# Patient Record
Sex: Female | Born: 1980 | Race: Black or African American | Hispanic: No | Marital: Married | Smoking: Former smoker
Health system: Southern US, Community
[De-identification: ages and names within clinical notes are randomized; demographics above are authoritative.]

## PROBLEM LIST (undated history)

## (undated) HISTORY — PX: LEEP: SHX91

---
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
## 2004-02-13 ENCOUNTER — Emergency Department: Payer: Self-pay | Admitting: Emergency Medicine

## 2004-05-22 ENCOUNTER — Emergency Department: Payer: Self-pay | Admitting: Emergency Medicine

## 2005-06-03 ENCOUNTER — Emergency Department: Payer: Self-pay | Admitting: Emergency Medicine

## 2006-05-21 ENCOUNTER — Emergency Department: Payer: Self-pay | Admitting: Emergency Medicine

## 2007-01-23 ENCOUNTER — Emergency Department: Payer: Self-pay | Admitting: Emergency Medicine

## 2007-05-06 ENCOUNTER — Emergency Department: Payer: Self-pay | Admitting: Emergency Medicine

## 2007-07-06 ENCOUNTER — Emergency Department: Payer: Self-pay | Admitting: Emergency Medicine

## 2007-12-23 ENCOUNTER — Emergency Department: Payer: Self-pay | Admitting: Emergency Medicine

## 2007-12-30 ENCOUNTER — Encounter: Payer: Self-pay | Admitting: Family Medicine

## 2008-02-09 ENCOUNTER — Observation Stay: Payer: Self-pay

## 2008-02-18 ENCOUNTER — Inpatient Hospital Stay: Payer: Self-pay | Admitting: Obstetrics and Gynecology

## 2009-06-16 ENCOUNTER — Emergency Department: Payer: Self-pay | Admitting: Emergency Medicine

## 2010-05-12 ENCOUNTER — Emergency Department: Payer: Self-pay | Admitting: Internal Medicine

## 2011-03-12 ENCOUNTER — Inpatient Hospital Stay: Payer: Self-pay | Admitting: Obstetrics and Gynecology

## 2012-01-30 ENCOUNTER — Emergency Department: Payer: Self-pay | Admitting: Internal Medicine

## 2012-01-30 LAB — COMPREHENSIVE METABOLIC PANEL
Albumin: 4.3 g/dL (ref 3.4–5.0)
Alkaline Phosphatase: 97 U/L (ref 50–136)
BUN: 14 mg/dL (ref 7–18)
Bilirubin,Total: 0.5 mg/dL (ref 0.2–1.0)
Chloride: 104 mmol/L (ref 98–107)
Creatinine: 0.81 mg/dL (ref 0.60–1.30)
EGFR (African American): >60
Glucose: 90 mg/dL (ref 65–99)
SGOT(AST): 27 U/L (ref 15–37)
SGPT (ALT): 42 U/L (ref 12–78)
Sodium: 137 mmol/L (ref 136–145)
Total Protein: 8.9 g/dL — ABNORMAL HIGH (ref 6.4–8.2)

## 2012-01-30 LAB — DRUG SCREEN, URINE
Amphetamines, Ur Screen: NEGATIVE (ref ?–1000)
Benzodiazepine, Ur Scrn: NEGATIVE (ref ?–200)
Cannabinoid 50 Ng, Ur ~~LOC~~: NEGATIVE (ref ?–50)
Cocaine Metabolite,Ur ~~LOC~~: NEGATIVE (ref ?–300)
MDMA (Ecstasy)Ur Screen: NEGATIVE (ref ?–500)
Methadone, Ur Screen: NEGATIVE (ref ?–300)
Opiate, Ur Screen: NEGATIVE (ref ?–300)
Phencyclidine (PCP) Ur S: NEGATIVE (ref ?–25)
Tricyclic, Ur Screen: NEGATIVE (ref ?–1000)

## 2012-01-30 LAB — CBC
MCH: 30 pg (ref 26.0–34.0)
MCHC: 33.5 g/dL (ref 32.0–36.0)
MCV: 90 fL (ref 80–100)
Platelet: 267 10*3/uL (ref 150–440)
RBC: 5.01 10*6/uL (ref 3.80–5.20)
RDW: 13.1 % (ref 11.5–14.5)

## 2012-01-30 LAB — URINALYSIS, COMPLETE
Bilirubin,UR: NEGATIVE
Glucose,UR: NEGATIVE mg/dL (ref 0–75)
Ketone: NEGATIVE
Ph: 5 (ref 4.5–8.0)
Protein: NEGATIVE
Specific Gravity: 1.026 (ref 1.003–1.030)
Squamous Epithelial: 12
WBC UR: 7 /HPF (ref 0–5)

## 2012-01-30 LAB — LIPASE, BLOOD: Lipase: 185 U/L (ref 73–393)

## 2013-04-02 ENCOUNTER — Emergency Department: Payer: Self-pay | Admitting: Emergency Medicine

## 2014-09-15 IMAGING — CT CT CERVICAL SPINE WITHOUT CONTRAST
4 series · 16 of 33 positions shown, 19 images · non-contrast
Comparison: None.

CLINICAL DATA: Slipped on ice, fall

EXAM:
CT HEAD WITHOUT CONTRAST
CT CERVICAL SPINE WITHOUT CONTRAST
TECHNIQUE: Multidetector CT imaging of the head and cervical spine was
performed following the standard protocol without intravenous
contrast. Multiplanar CT image reconstructions of the cervical spine
were also generated.

[Series 3: c spine soft · axial · 0.31mm/px · z∈[+1134,+1186]mm · 3 of 80 slices shown]
[im 14/80  soft-tissue]
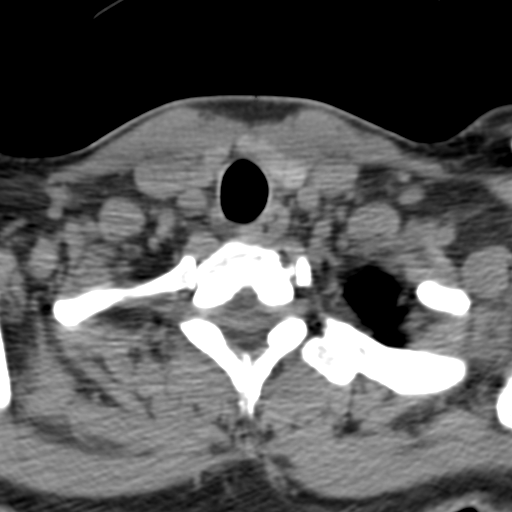
[im 27/80  soft-tissue]
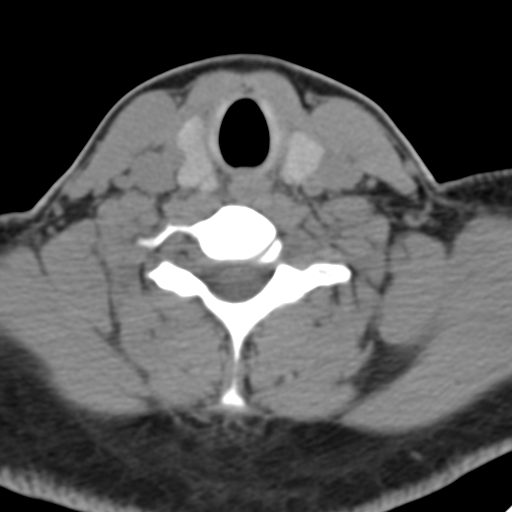
[im 40/80  soft-tissue]
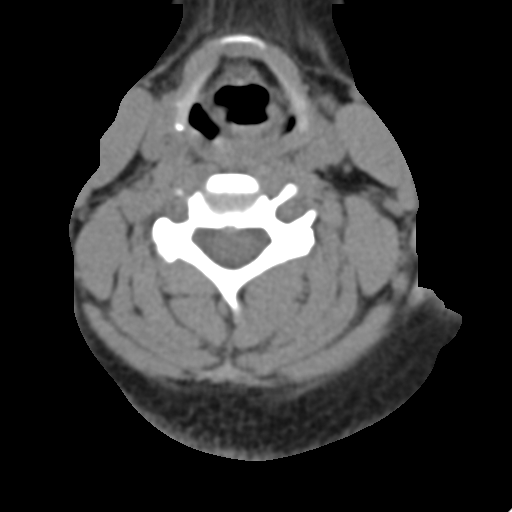

[Series 4: sag bone · sagittal · 0.33mm/px · 5 of 53 slices shown, 6 images]
[im 18/53  bone]
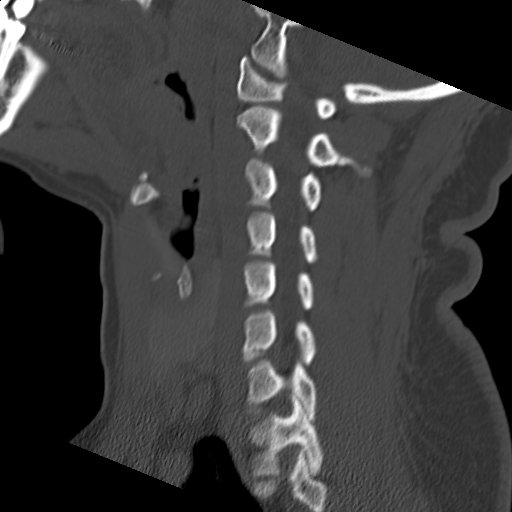
[im 22/53  bone]
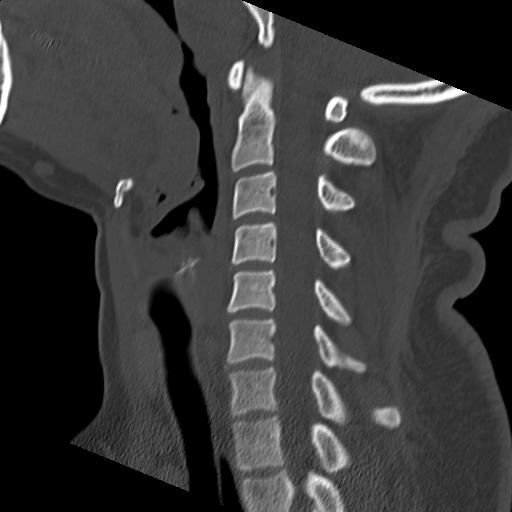
[im 27/53  soft-tissue]
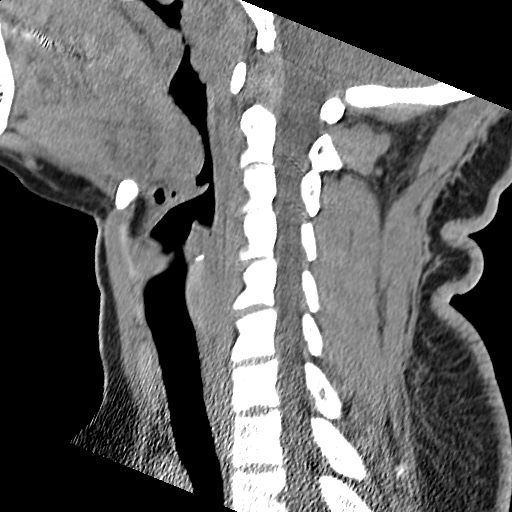
[im 27/53  bone]
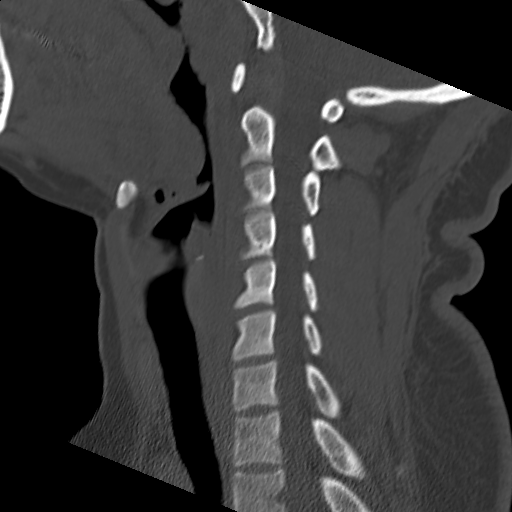
[im 31/53  bone]
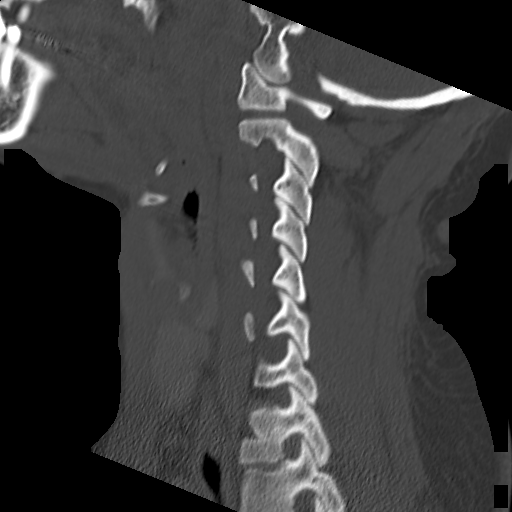
[im 35/53  bone]
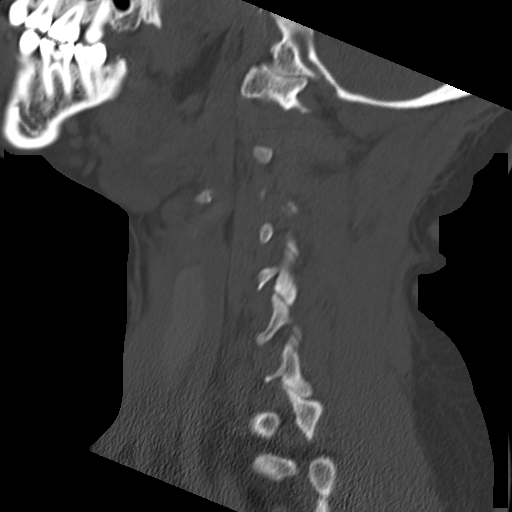

[Series 5: cor bone · coronal · 0.33mm/px · 3 of 36 slices shown]
[im 8/36  bone]
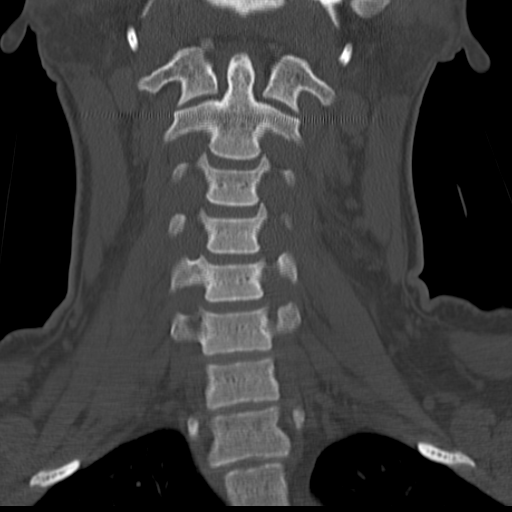
[im 15/36  bone]
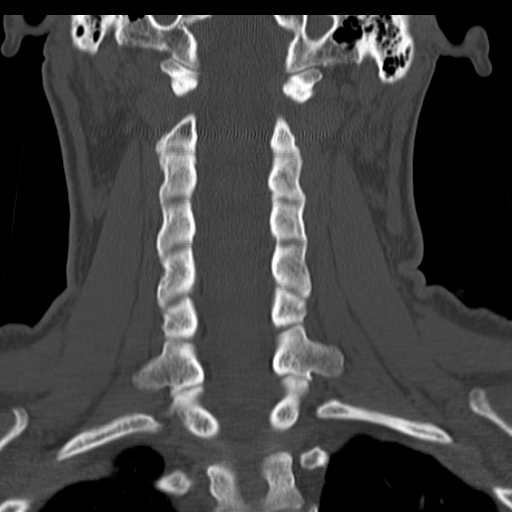
[im 22/36  bone]
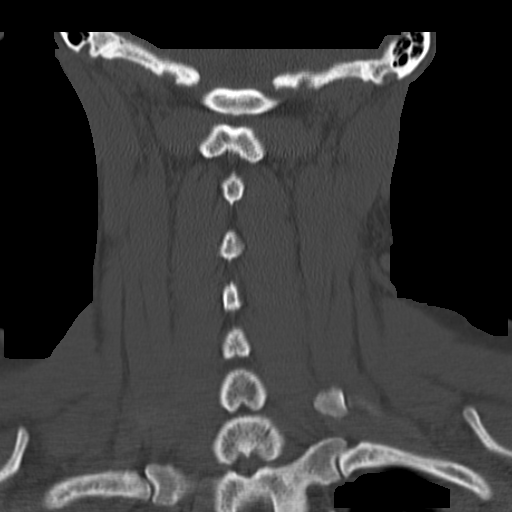

[Series 6: orthogonal axials · axial · 0.29mm/px · z∈[+1109,+1212]mm · 5 of 85 slices shown, 7 images]
[im 15/85  soft-tissue]
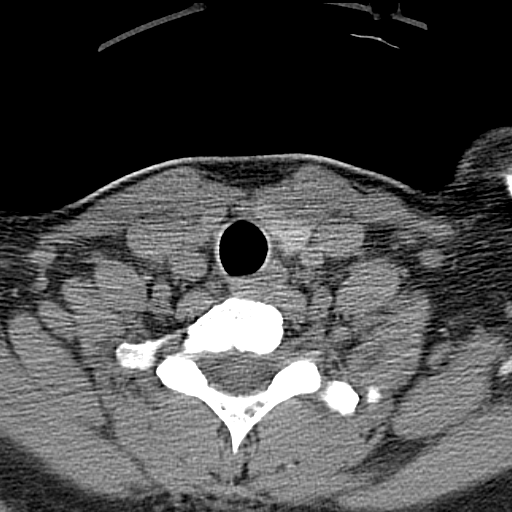
[im 15/85  bone]
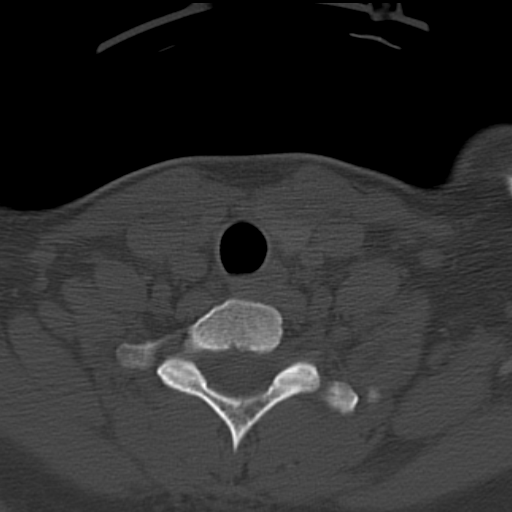
[im 29/85  bone]
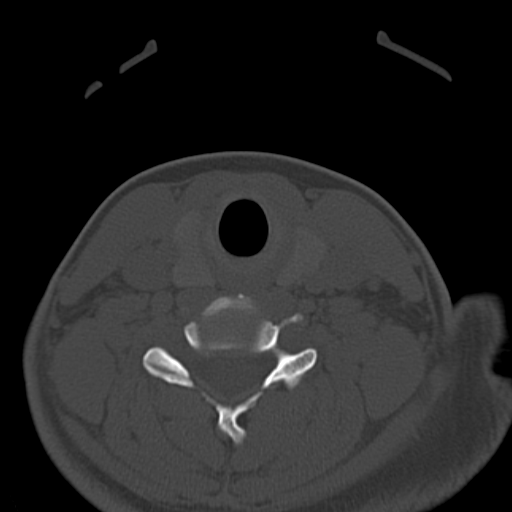
[im 43/85  bone]
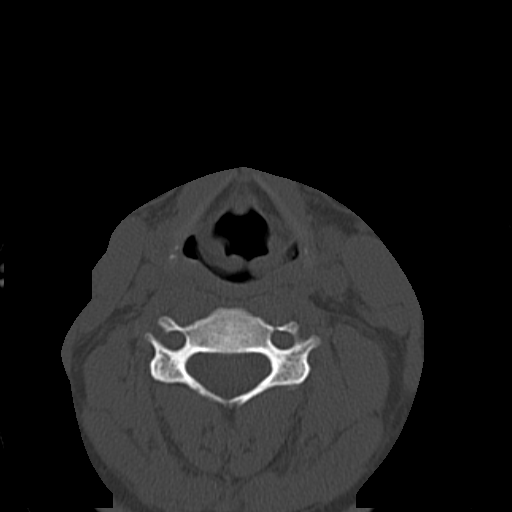
[im 57/85  bone]
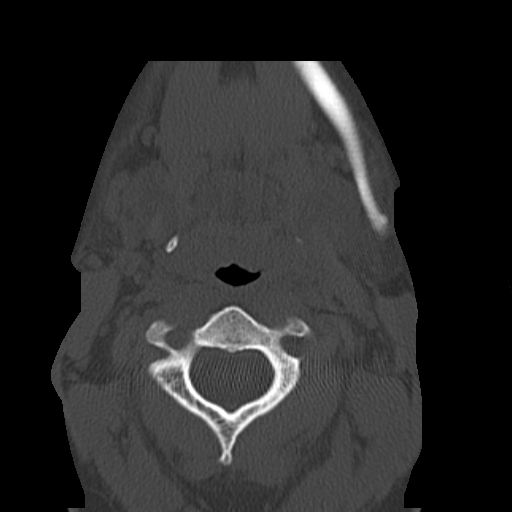
[im 71/85  soft-tissue]
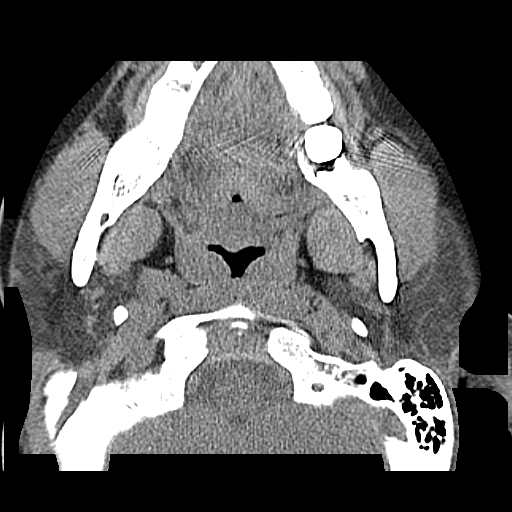
[im 71/85  bone]
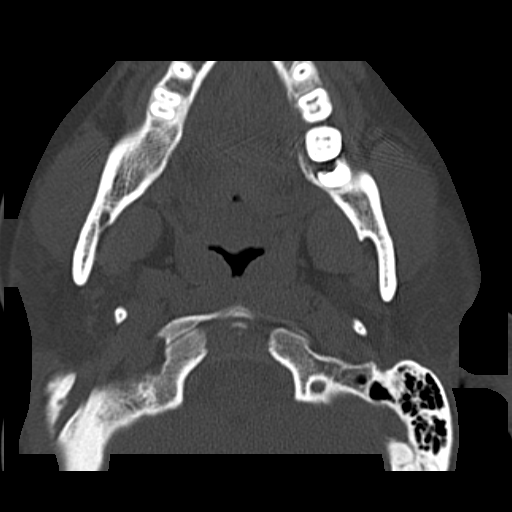

[16 of 33 positions shown; findings below may reference images not displayed]

FINDINGS: CT HEAD FINDINGS

Negative for acute intracranial hemorrhage, acute infarction, mass,
mass effect, hydrocephalus or midline shift. Gray-white
differentiation is preserved throughout. No focal scalp hematoma or
calvarial fracture. Globes and orbits are symmetric and intact
bilaterally.

CT CERVICAL SPINE FINDINGS

No acute fracture, malalignment or prevertebral soft tissue
swelling. Very mild degenerative change with early ossification of
the anterior anulus fibrosis at C5-C6 and C6-C7. Unremarkable CT
appearance of the thyroid gland. No acute soft tissue abnormality.
The lung apices are unremarkable.
IMPRESSION: CT HEAD

Negative head CT.

CT C SPINE

No acute fracture or malalignment.

## 2016-07-11 ENCOUNTER — Emergency Department
Admission: EM | Admit: 2016-07-11 | Discharge: 2016-07-11 | Disposition: A | Discharge status: Home / Self Care | Payer: BLUE CROSS/BLUE SHIELD | Attending: Emergency Medicine | Admitting: Emergency Medicine

## 2016-07-11 ENCOUNTER — Encounter: Payer: Self-pay | Admitting: *Deleted

## 2016-07-11 ENCOUNTER — Emergency Department: Payer: BLUE CROSS/BLUE SHIELD

## 2016-07-11 DIAGNOSIS — R1012 Left upper quadrant pain: Secondary | ICD-10-CM

## 2016-07-11 DIAGNOSIS — K219 Gastro-esophageal reflux disease without esophagitis: Secondary | ICD-10-CM | POA: Insufficient documentation

## 2016-07-11 LAB — BASIC METABOLIC PANEL
Anion gap: 7 (ref 5–15)
BUN: 12 mg/dL (ref 6–20)
CHLORIDE: 105 mmol/L (ref 101–111)
CO2: 25 mmol/L (ref 22–32)
CREATININE: 0.75 mg/dL (ref 0.44–1.00)
Calcium: 9.2 mg/dL (ref 8.9–10.3)
GFR calc Af Amer: >60 mL/min (ref >60)
GFR calc non Af Amer: >60 mL/min (ref >60)
Glucose, Bld: 94 mg/dL (ref 65–99)
Potassium: 3.6 mmol/L (ref 3.5–5.1)
Sodium: 137 mmol/L (ref 135–145)

## 2016-07-11 LAB — CBC
HCT: 41.2 % (ref 35.0–47.0)
Hemoglobin: 13.9 g/dL (ref 12.0–16.0)
MCH: 30 pg (ref 26.0–34.0)
MCHC: 33.7 g/dL (ref 32.0–36.0)
MCV: 88.8 fL (ref 80.0–100.0)
PLATELETS: 279 10*3/uL (ref 150–440)
RBC: 4.64 MIL/uL (ref 3.80–5.20)
RDW: 13.6 % (ref 11.5–14.5)
WBC: 5.7 10*3/uL (ref 3.6–11.0)

## 2016-07-11 LAB — HEPATIC FUNCTION PANEL
ALT: 16 U/L (ref 14–54)
AST: 21 U/L (ref 15–41)
Albumin: 4 g/dL (ref 3.5–5.0)
Alkaline Phosphatase: 61 U/L (ref 38–126)
BILIRUBIN TOTAL: 0.6 mg/dL (ref 0.3–1.2)
Total Protein: 7.5 g/dL (ref 6.5–8.1)

## 2016-07-11 LAB — LIPASE, BLOOD: LIPASE: 34 U/L (ref 11–51)

## 2016-07-11 LAB — TROPONIN I: Troponin I: <0.03 ng/mL (ref <0.03)

## 2016-07-11 MED ORDER — METOCLOPRAMIDE HCL 10 MG PO TABS
10.0000 mg | ORAL_TABLET | Freq: Once | ORAL | Status: AC
  Administered 2016-07-11: 10 mg via ORAL
  Filled 2016-07-11: qty 1

## 2016-07-11 MED ORDER — METOCLOPRAMIDE HCL 10 MG PO TABS: 10.0000 mg | ORAL_TABLET | Freq: Four times a day (QID) | ORAL | 0 refills | Status: DC | PRN

## 2016-07-11 MED ORDER — FAMOTIDINE 20 MG PO TABS
40.0000 mg | ORAL_TABLET | Freq: Once | ORAL | Status: AC
  Administered 2016-07-11: 40 mg via ORAL
  Filled 2016-07-11: qty 2

## 2016-07-11 MED ORDER — FAMOTIDINE 20 MG PO TABS: 20.0000 mg | ORAL_TABLET | Freq: Two times a day (BID) | ORAL | 0 refills | Status: DC

## 2016-07-11 MED ORDER — GI COCKTAIL ~~LOC~~
30.0000 mL | ORAL | Status: AC
  Administered 2016-07-11: 30 mL via ORAL
  Filled 2016-07-11: qty 30

## 2016-07-11 NOTE — ED Notes (Signed)
Pt verbalized understanding of discharge instructions. NAD at this time. 

## 2016-07-11 NOTE — ED Triage Notes (Signed)
States mid chest burning for several weeks, states [facility] was seen [facility] and after an EKG they gave her carafate, states no relief after taking the carafate for 1 day, awake and alert in no acute distress

## 2016-07-11 NOTE — ED Provider Notes (Signed)
[facility] Emergency Department Provider Note  ____________________________________________  Time seen: Approximately 1:04 PM  I have reviewed the triage vital signs and the nursing notes.   HISTORY  Chief Complaint Chest Pain    HPI Nicole Mcmillan is a 36 y.o. female who complains of left upper quadrant abdominal pain radiating up into the chest. This is been on for several weeks, intermittent. Not exertional, not pleuritic. No fever or chills. No hematemesis. No vomiting or diarrhea or constipation. No prior abdominal surgeries. Not affected by eating. Nonradiating. Mild to moderate intensity. Aching.  She saw urgent care yesterday and they gave her Carafate but this has not resolved her symptoms in the last 24 hours.     History reviewed. No pertinent past medical history.   There are no active problems to display for this patient.    No past surgical history on file. Negative  Prior to Admission medications   Medication Sig Start Date End Date Taking? Authorizing Provider  famotidine (PEPCID) 20 MG tablet Take 1 tablet (20 mg total) by mouth 2 (two) times daily. 07/11/16   Sharman Cheek, MD  metoCLOPramide (REGLAN) 10 MG tablet Take 1 tablet (10 mg total) by mouth every 6 (six) hours as needed. 07/11/16   Sharman Cheek, MD     Allergies Flagyl [metronidazole]   History reviewed. No pertinent family history.  Social History Social History  Substance Use Topics  . Smoking status: Not on file  . Smokeless tobacco: Not on file  . Alcohol use Not on file  No tobacco or alcohol use   Review of Systems  Constitutional:   No fever or chills.  ENT:   No sore throat. No rhinorrhea. Lymphatic: No swollen glands, No extremity swelling Endocrine: No hot/cold flashes. No significant weight change. No neck swelling. Cardiovascular:   No chest pain or syncope. Respiratory:   No dyspnea or cough. Gastrointestinal:   Positive for upper  abdominal pain without, vomiting and diarrhea.  Genitourinary:   Negative for dysuria or difficulty urinating. Musculoskeletal:   Negative for focal pain or swelling Neurological:   Negative for headaches or weakness. All other systems reviewed and are negative except as documented above in ROS and HPI.  ____________________________________________   PHYSICAL EXAM:  VITAL SIGNS: ED Triage Vitals [07/11/16 0957]  Enc Vitals Group     BP 138/73     Pulse Rate 72     Resp 18     Temp 99 F (37.2 C)     Temp Source Oral     SpO2 100 %     Weight 180 lb (81.6 kg)     Height  (1.702 m)     Head Circumference      Peak Flow      Pain Score 7     Pain Loc      Pain Edu?      Excl. in GC?     Vital signs reviewed, nursing assessments reviewed.   Constitutional:   Alert and oriented. Well appearing and in no distress. Eyes:   No scleral icterus. No conjunctival pallor. PERRL. EOMI.  No nystagmus. ENT   Head:   Normocephalic and atraumatic.   Nose:   No congestion/rhinnorhea. No septal hematoma   Mouth/Throat:   MMM, no pharyngeal erythema. No peritonsillar mass.    Neck:   No stridor. No SubQ emphysema. No meningismus. Hematological/Lymphatic/Immunilogical:   No cervical lymphadenopathy. Cardiovascular:   RRR. Symmetric bilateral radial and DP pulses.  No murmurs.  Respiratory:   Normal respiratory effort without tachypnea nor retractions. Breath sounds are clear and equal bilaterally. No wheezes/rales/rhonchi.Chest wall nontender Gastrointestinal:   Soft With mild left upper quadrant tenderness. Non distended. There is no CVA tenderness.  No rebound, rigidity, or guarding. Genitourinary:   deferred Musculoskeletal:   Normal range of motion in all extremities. No joint effusions.  No lower extremity tenderness.  No edema. Neurologic:   Normal speech and language.  CN 2-10 normal. Motor grossly intact. No gross focal neurologic deficits are appreciated.   Skin:    Skin is warm, dry and intact. No rash noted.  No petechiae, purpura, or bullae.  ____________________________________________    LABS (pertinent positives/negatives) (all labs ordered are listed, but only abnormal results are displayed) Labs Reviewed  HEPATIC FUNCTION PANEL - Abnormal; Notable for the following:       Result Value   Bilirubin, Direct <0.1 (*)    All other components within normal limits  BASIC METABOLIC PANEL  CBC  TROPONIN I  LIPASE, BLOOD   ____________________________________________   EKG  Interpreted by me  Date: 07/11/2016  Rate: 71  Rhythm: normal sinus rhythm  QRS Axis: normal  Intervals: normal  ST/T Wave abnormalities: normal  Conduction Disutrbances: none  Narrative Interpretation: unremarkable      ____________________________________________    RADIOLOGY  Dg Chest 2 View  Result Date: 07/11/2016 CLINICAL DATA:  Chest pain for several weeks. New onset shortness of breath today. EXAM: CHEST  2 VIEW COMPARISON:  None. FINDINGS: The heart size and mediastinal contours are within normal limits. Tiny right lung calcified granulomas noted. No evidence of pulmonary infiltrate or edema. No evidence of pneumothorax or pleural effusion. The visualized skeletal structures are unremarkable. IMPRESSION: No active cardiopulmonary disease. Electronically Signed   By: Myles Rosenthal M.D.   On: 07/11/2016 10:18    ____________________________________________   PROCEDURES Procedures  ____________________________________________   INITIAL IMPRESSION / ASSESSMENT AND PLAN / ED COURSE  Pertinent labs & imaging results that were available during my care of the patient were reviewed by me and considered in my medical decision making (see chart for details).  Patient presents with left upper quadrant abdominal pain, history highly consistent with GERD and gastritis.Considering the patient's symptoms, medical history, and physical examination  today, I have low suspicion for cholecystitis or biliary pathology, pancreatitis, perforation or bowel obstruction, hernia, intra-abdominal abscess, AAA or dissection, volvulus or intussusception, mesenteric ischemia, or appendicitis.  Patient given antacids in the ED for symptomatically relief. Labs unremarkable, EKG negative and normal. On discharge her on Pepcid and Reglan, continue the Carafate at home, follow-up with primary care.         ____________________________________________   FINAL CLINICAL IMPRESSION(S) / ED DIAGNOSES  Final diagnoses:  Left upper quadrant pain  Gastroesophageal reflux disease, esophagitis presence not specified      New Prescriptions   FAMOTIDINE (PEPCID) 20 MG TABLET    Take 1 tablet (20 mg total) by mouth 2 (two) times daily.   METOCLOPRAMIDE (REGLAN) 10 MG TABLET    Take 1 tablet (10 mg total) by mouth every 6 (six) hours as needed.     Portions of this note were generated with dragon dictation software. Dictation errors may occur despite best attempts at proofreading.    Sharman Cheek, MD 07/11/16 [phone]

## 2016-07-11 NOTE — Discharge Instructions (Signed)
Your lab tests today are unremarkable.  Take Pepcid and Reglan to decrease your stomach acid and control these symptoms.

## 2017-01-15 ENCOUNTER — Encounter: Payer: Self-pay | Admitting: Emergency Medicine

## 2017-01-15 DIAGNOSIS — R102 Pelvic and perineal pain: Secondary | ICD-10-CM | POA: Diagnosis present

## 2017-01-15 DIAGNOSIS — Z79899 Other long term (current) drug therapy: Secondary | ICD-10-CM | POA: Diagnosis not present

## 2017-01-15 DIAGNOSIS — N760 Acute vaginitis: Secondary | ICD-10-CM | POA: Insufficient documentation

## 2017-01-15 DIAGNOSIS — B9689 Other specified bacterial agents as the cause of diseases classified elsewhere: Secondary | ICD-10-CM | POA: Diagnosis not present

## 2017-01-15 DIAGNOSIS — F17200 Nicotine dependence, unspecified, uncomplicated: Secondary | ICD-10-CM | POA: Diagnosis not present

## 2017-01-15 DIAGNOSIS — A5901 Trichomonal vulvovaginitis: Secondary | ICD-10-CM | POA: Diagnosis not present

## 2017-01-15 LAB — URINALYSIS, COMPLETE (UACMP) WITH MICROSCOPIC
BACTERIA UA: NONE SEEN
BILIRUBIN URINE: NEGATIVE
GLUCOSE, UA: NEGATIVE mg/dL
HGB URINE DIPSTICK: NEGATIVE
Ketones, ur: NEGATIVE mg/dL
LEUKOCYTES UA: NEGATIVE
NITRITE: NEGATIVE
Protein, ur: NEGATIVE mg/dL
SPECIFIC GRAVITY, URINE: 1.027 (ref 1.005–1.030)
pH: 5 (ref 5.0–8.0)

## 2017-01-15 LAB — POCT PREGNANCY, URINE: Preg Test, Ur: NEGATIVE

## 2017-01-15 NOTE — ED Triage Notes (Addendum)
Patient to ER for c/o vaginal discharge. States at times it smells like blood, other times has foul odor. States LMP was 12/23/16 and lasted one week, but was lighter than normal. Patient states approx one week later, she developed current discharge and lower pelvic pain. States pain is worst at what feels like her cervix. Also reports taking Macca root with MVI.

## 2017-01-16 ENCOUNTER — Emergency Department
Admission: EM | Admit: 2017-01-16 | Discharge: 2017-01-16 | Disposition: A | Discharge status: Home / Self Care | Payer: BLUE CROSS/BLUE SHIELD | Attending: Emergency Medicine | Admitting: Emergency Medicine

## 2017-01-16 DIAGNOSIS — B9689 Other specified bacterial agents as the cause of diseases classified elsewhere: Secondary | ICD-10-CM

## 2017-01-16 DIAGNOSIS — A5901 Trichomonal vulvovaginitis: Secondary | ICD-10-CM

## 2017-01-16 DIAGNOSIS — N760 Acute vaginitis: Secondary | ICD-10-CM

## 2017-01-16 LAB — WET PREP, GENITAL
SPERM: NONE SEEN
YEAST WET PREP: NONE SEEN

## 2017-01-16 LAB — CHLAMYDIA/NGC RT PCR (ARMC ONLY)
CHLAMYDIA TR: NOT DETECTED
N gonorrhoeae: NOT DETECTED

## 2017-01-16 MED ORDER — METRONIDAZOLE 500 MG PO TABS
500.0000 mg | ORAL_TABLET | Freq: Once | ORAL | Status: AC
  Administered 2017-01-16: 500 mg via ORAL
  Filled 2017-01-16: qty 1

## 2017-01-16 MED ORDER — METRONIDAZOLE 500 MG PO TABS: 500.0000 mg | ORAL_TABLET | Freq: Two times a day (BID) | ORAL | 0 refills | Status: AC

## 2017-01-16 NOTE — ED Notes (Signed)
Report off to kasey rn  

## 2017-01-16 NOTE — ED Notes (Addendum)
Pt denies rxn to the flagyl given earlier. Pt in NAD at this time, denies rash. EDP notified.

## 2017-01-16 NOTE — ED Notes (Signed)

## 2017-01-16 NOTE — ED Notes (Signed)
ED Provider at bedside. 

## 2017-01-16 NOTE — ED Provider Notes (Signed)
[facility] Emergency Department Provider Note   First MD Initiated Contact with Patient 01/16/17 0130     (approximate)  I have reviewed the triage vital signs and the nursing notes.   HISTORY  Chief Complaint Vaginal Discharge and Pelvic Pain   HPI Nicole Mcmillan is a 36 y.o. female presents to the emergency department with complaint of malodorous vaginal discharge times approximately one week patient admits to lower pelvic discomfort none at present. Patient denies any fever no nausea or vomiting. Patient admits to previous history of Chlamydia when she was a teenager. Patient admits to unprotected sex one partner   Past medical history Chlamydia There are no active problems to display for this patient.   Past surgical history None  Prior to Admission medications   Medication Sig Start Date End Date Taking? Authorizing Provider  famotidine (PEPCID) 20 MG tablet Take 1 tablet (20 mg total) by mouth 2 (two) times daily. 07/11/16   Sharman Cheek, MD  metoCLOPramide (REGLAN) 10 MG tablet Take 1 tablet (10 mg total) by mouth every 6 (six) hours as needed. 07/11/16   Sharman Cheek, MD    Allergies Flagyl [metronidazole]  No family history on file.  Social History Social History  Substance Use Topics  . Smoking status: Current Every Day Smoker  . Smokeless tobacco: Never Used  . Alcohol use No    Review of Systems Constitutional: No fever/chills Eyes: No visual changes. ENT: No sore throat. Cardiovascular: Denies chest pain. Respiratory: Denies shortness of breath. Gastrointestinal: No abdominal pain.  No nausea, no vomiting.  No diarrhea.  No constipation. Genitourinary: Negative for dysuria. Positive for vaginal discharge Musculoskeletal: Negative for neck pain.  Negative for back pain. Integumentary: Negative for rash. Neurological: Negative for headaches, focal weakness or  numbness.   ____________________________________________   PHYSICAL EXAM:  VITAL SIGNS: ED Triage Vitals  Enc Vitals Group     BP 01/15/17 2130 125/84     Pulse Rate 01/15/17 2130 98     Resp 01/15/17 2130 18     Temp 01/15/17 2130 98.7 F (37.1 C)     Temp Source 01/15/17 2130 Oral     SpO2 01/15/17 2130 99 %     Weight --      Height 01/15/17 2126 1.651 m (5\' 5" )     Head Circumference --      Peak Flow --      Pain Score 01/15/17 2125 6     Pain Loc --      Pain Edu? --      Excl. in GC? --     Constitutional: Alert and oriented. Well appearing and in no acute distress. Eyes: Conjunctivae are normal.  Head: Atraumatic. Mouth/Throat: Mucous membranes are moist.  Oropharynx non-erythematous. Neck: No stridor.   Cardiovascular: Normal rate, regular rhythm. Good peripheral circulation. Grossly normal heart sounds. Respiratory: Normal respiratory effort.  No retractions. Lungs CTAB. Gastrointestinal: Soft and nontender. No distention.  Genitourinary: Edmar Blankenburg frothy malodorous discharge Musculoskeletal: No lower extremity tenderness nor edema. No gross deformities of extremities. Neurologic:  Normal speech and language. No gross focal neurologic deficits are appreciated.  Skin:  Skin is warm, dry and intact. No rash noted. Psychiatric: Mood and affect are normal. Speech and behavior are normal.  ____________________________________________   LABS (all labs ordered are listed, but only abnormal results are displayed)  Labs Reviewed  WET PREP, GENITAL - Abnormal; Notable for the following:       Result Value  Trich, Wet Prep PRESENT (*)    Clue Cells Wet Prep HPF POC PRESENT (*)    WBC, Wet Prep HPF POC MODERATE (*)    All other components within normal limits  URINALYSIS, COMPLETE (UACMP) WITH MICROSCOPIC - Abnormal; Notable for the following:    Color, Urine YELLOW (*)    APPearance CLEAR (*)    Squamous Epithelial / LPF 0-5 (*)    All other components within  normal limits  CHLAMYDIA/NGC RT PCR ([facility] ONLY)  POC URINE PREG, ED  POCT PREGNANCY, URINE    Procedures   ____________________________________________   INITIAL IMPRESSION / ASSESSMENT AND PLAN / ED COURSE  As part of my medical decision making, I reviewed the following data within the electronic MEDICAL RECORD [id number] year-old female presenting with above stated history physical exam of malodorous vaginal discharge. Cervical swab revealed evidence of bacterial vaginosis and Trichomonas as such patient will be prescribed Flagyl for home. Patient unclear of any reaction to Flagyl in the past and a such patient was given a dose of Flagyl emergency department and observed without any symptoms.     ____________________________________________  FINAL CLINICAL IMPRESSION(S) / ED DIAGNOSES  Final diagnoses:  BV (bacterial vaginosis)  Trichomoniasis of vagina     MEDICATIONS GIVEN DURING THIS VISIT:  Medications - No data to display   NEW OUTPATIENT MEDICATIONS STARTED DURING THIS VISIT:  New Prescriptions   No medications on file    Modified Medications   No medications on file    Discontinued Medications   No medications on file     Note:  This document was prepared using Dragon voice recognition software and may include unintentional dictation errors.    Darci CurrentBrown, [place] N, MD 01/16/17 [phone]

## 2017-04-22 DIAGNOSIS — E669 Obesity, unspecified: Secondary | ICD-10-CM | POA: Insufficient documentation

## 2017-04-22 LAB — HM PAP SMEAR: HM Pap smear: NEGATIVE

## 2017-12-24 IMAGING — CR DG CHEST 2V
1 series · 2 of 2 positions shown · non-contrast
Comparison: None.

CLINICAL DATA: Chest pain for several weeks. New onset shortness of
breath today.

EXAM:
CHEST  2 VIEW

[Series 1: dg chest 2 view · 0.14mm/px · 2 of 2 slices shown]
[im 1/2]
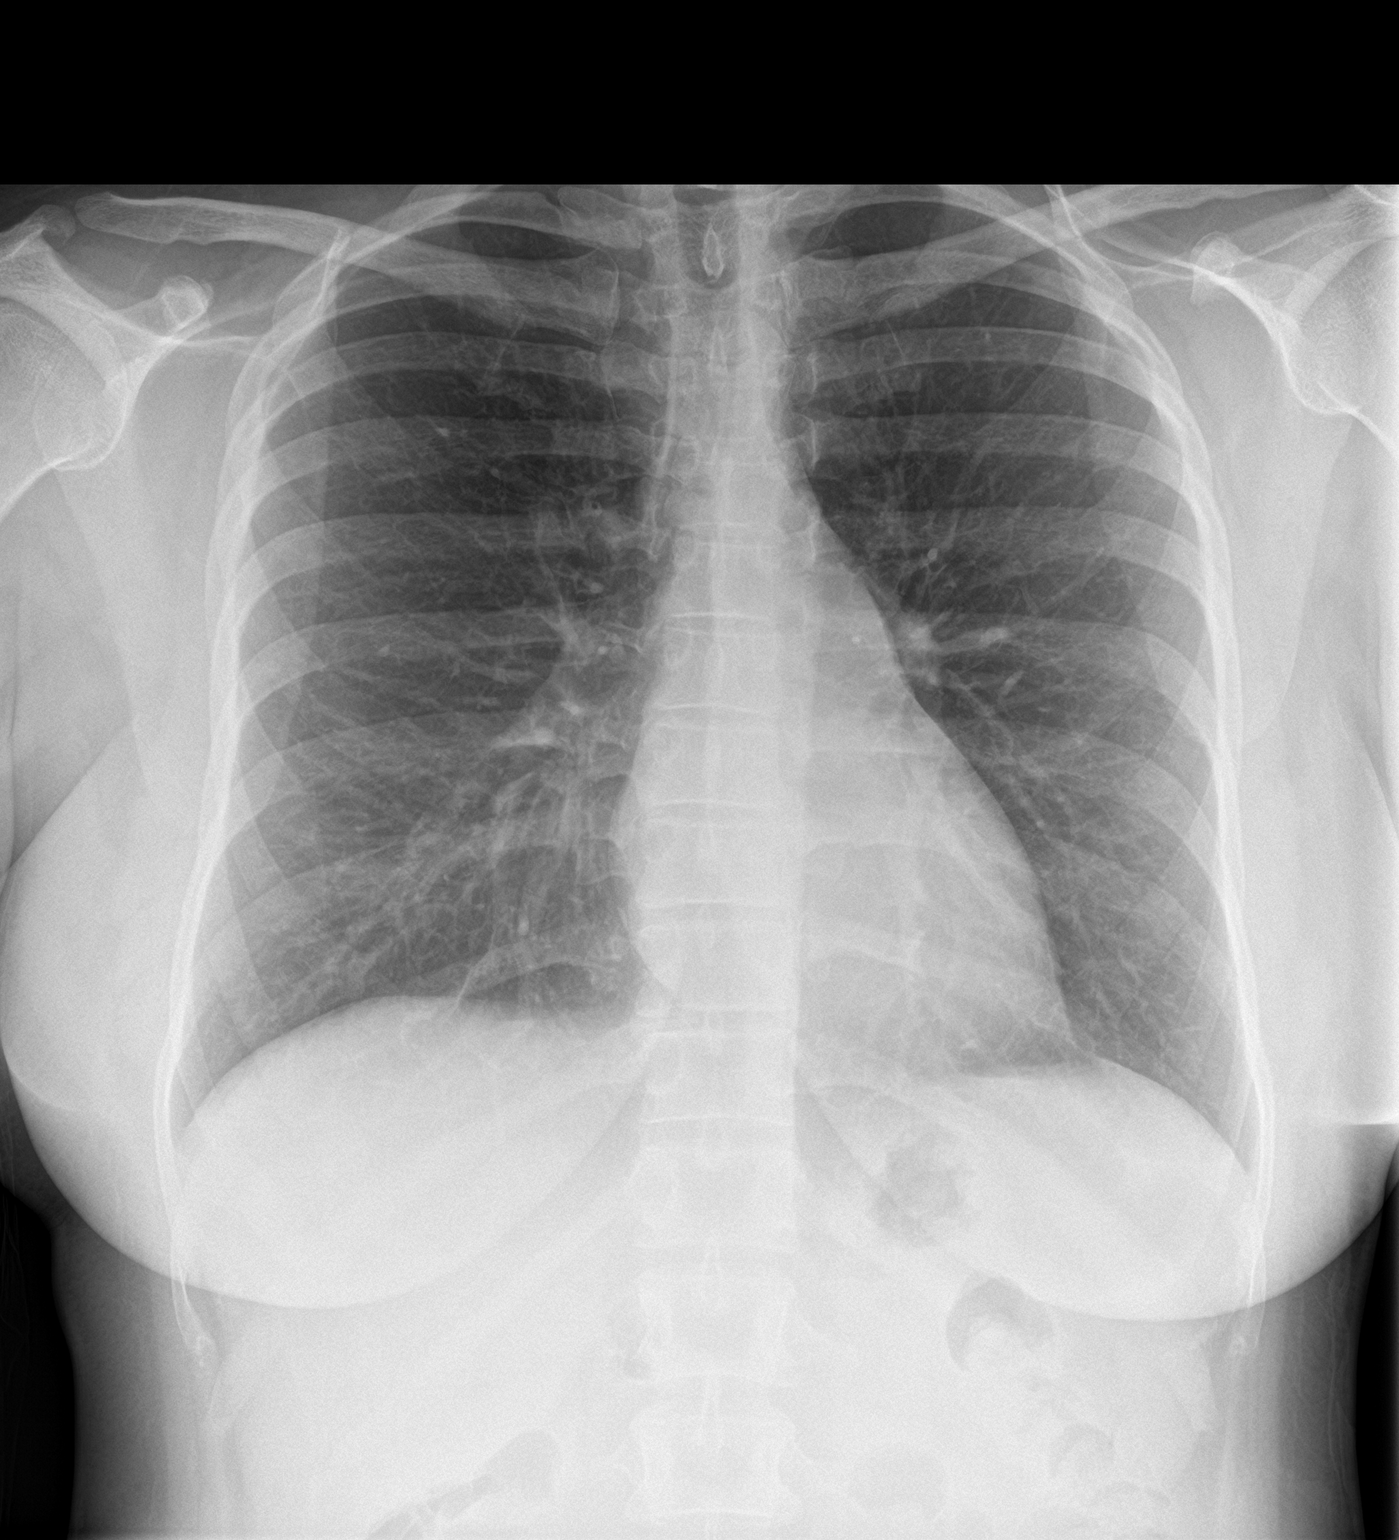
[im 2/2]
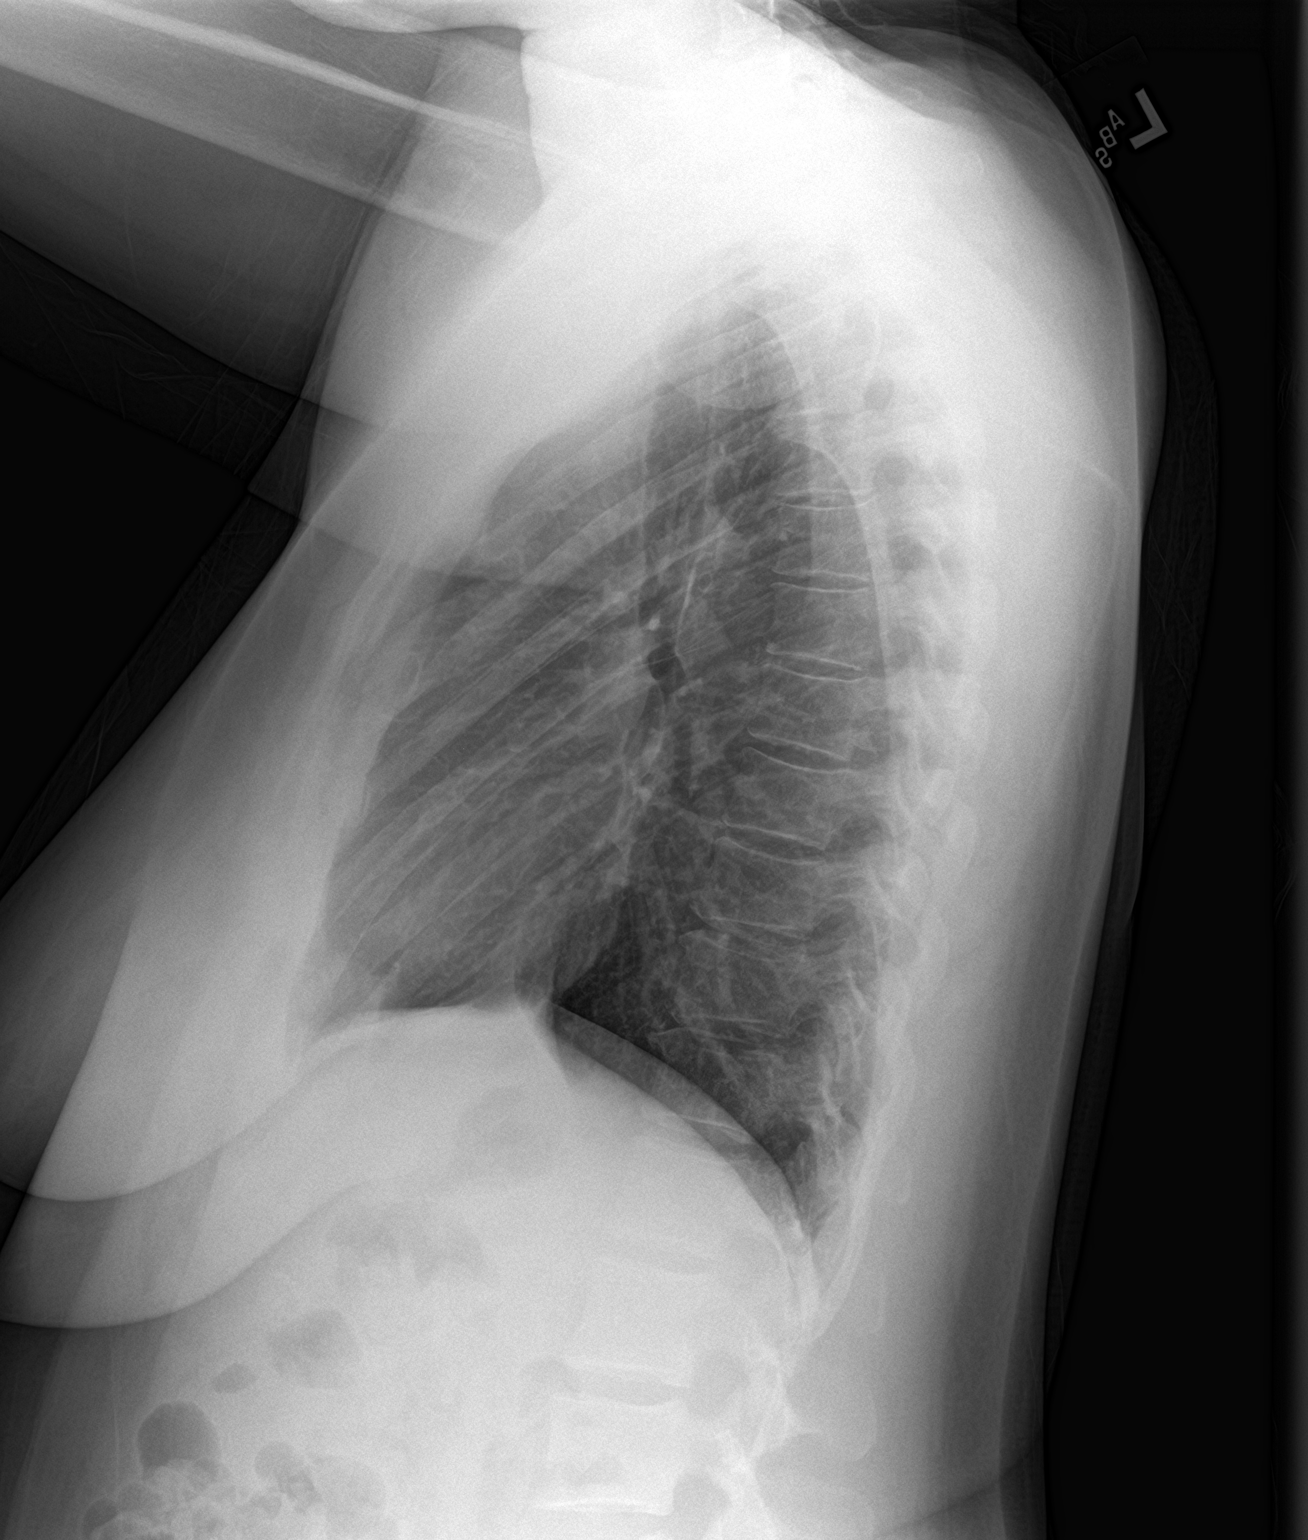

[2 of 2 positions shown; findings below may reference images not displayed]

FINDINGS: The heart size and mediastinal contours are within normal limits.
Tiny right lung calcified granulomas noted. No evidence of pulmonary
infiltrate or edema. No evidence of pneumothorax or pleural
effusion. The visualized skeletal structures are unremarkable.
IMPRESSION: No active cardiopulmonary disease.

## 2018-06-02 DIAGNOSIS — Z1331 Encounter for screening for depression: Secondary | ICD-10-CM | POA: Insufficient documentation

## 2018-11-03 DIAGNOSIS — E669 Obesity, unspecified: Secondary | ICD-10-CM

## 2018-11-03 DIAGNOSIS — Z1331 Encounter for screening for depression: Secondary | ICD-10-CM

## 2018-11-04 ENCOUNTER — Other Ambulatory Visit: Payer: Self-pay

## 2018-11-04 ENCOUNTER — Ambulatory Visit (LOCAL_COMMUNITY_HEALTH_CENTER): Payer: Self-pay

## 2018-11-04 VITALS — BP 125/88 | Ht 63.0 in | Wt 208.0 lb

## 2018-11-04 DIAGNOSIS — Z30013 Encounter for initial prescription of injectable contraceptive: Secondary | ICD-10-CM

## 2018-11-04 DIAGNOSIS — Z3009 Encounter for other general counseling and advice on contraception: Secondary | ICD-10-CM

## 2018-11-04 MED ORDER — MULTI-VITAMIN/MINERALS PO TABS: 1.0000 | ORAL_TABLET | Freq: Every day | ORAL | 0 refills | Status: DC

## 2018-11-04 MED ORDER — MEDROXYPROGESTERONE ACETATE 150 MG/ML IM SUSP
150.0000 mg | Freq: Once | INTRAMUSCULAR | Status: AC
  Administered 2018-11-04: 150 mg via INTRAMUSCULAR

## 2018-11-04 NOTE — Progress Notes (Signed)
Depo administered without difficulty per 06/02/2018 written order of Jerline Pain FNP-BC and client tolerated without complaint. Shona Needles, RN

## 2019-01-21 ENCOUNTER — Other Ambulatory Visit: Payer: Self-pay

## 2019-01-21 ENCOUNTER — Ambulatory Visit (LOCAL_COMMUNITY_HEALTH_CENTER): Payer: Self-pay

## 2019-01-21 VITALS — BP 137/80 | Ht 63.0 in | Wt 206.0 lb

## 2019-01-21 DIAGNOSIS — Z3009 Encounter for other general counseling and advice on contraception: Secondary | ICD-10-CM

## 2019-01-21 DIAGNOSIS — Z30013 Encounter for initial prescription of injectable contraceptive: Secondary | ICD-10-CM

## 2019-01-21 MED ORDER — MEDROXYPROGESTERONE ACETATE 150 MG/ML IM SUSP
150.0000 mg | Freq: Once | INTRAMUSCULAR | Status: AC
  Administered 2019-01-21: 150 mg via INTRAMUSCULAR

## 2019-01-21 NOTE — Progress Notes (Signed)
Depo given per Wilburn Mylar FNP 05/2018 order. Tolerated well Aileen Fass, RN

## 2019-04-21 ENCOUNTER — Ambulatory Visit (LOCAL_COMMUNITY_HEALTH_CENTER): Payer: 59

## 2019-04-21 ENCOUNTER — Other Ambulatory Visit: Payer: Self-pay

## 2019-04-21 VITALS — BP 147/101 | Ht 63.0 in | Wt 200.5 lb

## 2019-04-21 DIAGNOSIS — Z3009 Encounter for other general counseling and advice on contraception: Secondary | ICD-10-CM

## 2019-04-21 DIAGNOSIS — Z30013 Encounter for initial prescription of injectable contraceptive: Secondary | ICD-10-CM | POA: Diagnosis not present

## 2019-04-21 MED ORDER — MEDROXYPROGESTERONE ACETATE 150 MG/ML IM SUSP
150.0000 mg | Freq: Once | INTRAMUSCULAR | Status: AC
  Administered 2019-04-21: 17:00:00 150 mg via INTRAMUSCULAR

## 2019-04-21 NOTE — Progress Notes (Signed)
Client with current Depo order (06/02/2018 per Jerilee Hoh FNP-BC), but BP today is 147/101 with 10 minute repeat essentially unchanged. Following consutl with Sadie Haber PA, the following verbal orders given: 1) may administer Depo 150 mg IM today and 2) recommend BP evaluation with PCP. Client tolerated Depo without complaint. Client provided [employer] info sheet and counseled on income based fees at community health centers. Jossie Ng, RN

## 2019-04-23 NOTE — Progress Notes (Signed)
Consulted by RN re: elevated BP.  Ok to give Depo today.  Rec that patient follow up with PCP for further evaluation for BP.

## 2019-07-21 ENCOUNTER — Ambulatory Visit: Payer: 59

## 2019-07-22 ENCOUNTER — Other Ambulatory Visit: Payer: Self-pay | Admitting: Physician Assistant

## 2019-07-22 DIAGNOSIS — Z3042 Encounter for surveillance of injectable contraceptive: Secondary | ICD-10-CM

## 2019-07-22 MED ORDER — MEDROXYPROGESTERONE ACETATE 150 MG/ML IM SUSP: 150.0000 mg | Freq: Once | INTRAMUSCULAR | Status: DC

## 2019-07-22 NOTE — Progress Notes (Signed)
Per chart review, last RP 05/2018, CBE due 2022 and pap due 2024.  OK for Depo x 1, but will need to schedule for RP.

## 2019-07-30 ENCOUNTER — Encounter: Payer: Self-pay | Admitting: Physician Assistant

## 2019-07-30 ENCOUNTER — Ambulatory Visit: Payer: 59

## 2019-07-30 ENCOUNTER — Other Ambulatory Visit: Payer: Self-pay

## 2019-07-30 ENCOUNTER — Ambulatory Visit (LOCAL_COMMUNITY_HEALTH_CENTER): Payer: 59 | Admitting: Physician Assistant

## 2019-07-30 VITALS — BP 124/86 | Ht 63.0 in | Wt 196.8 lb

## 2019-07-30 DIAGNOSIS — Z3009 Encounter for other general counseling and advice on contraception: Secondary | ICD-10-CM | POA: Diagnosis not present

## 2019-07-30 DIAGNOSIS — Z30013 Encounter for initial prescription of injectable contraceptive: Secondary | ICD-10-CM

## 2019-07-30 DIAGNOSIS — E669 Obesity, unspecified: Secondary | ICD-10-CM | POA: Diagnosis not present

## 2019-07-30 DIAGNOSIS — Z720 Tobacco use: Secondary | ICD-10-CM | POA: Diagnosis not present

## 2019-07-30 DIAGNOSIS — Z6835 Body mass index (BMI) 35.0-35.9, adult: Secondary | ICD-10-CM

## 2019-07-30 DIAGNOSIS — Z1331 Encounter for screening for depression: Secondary | ICD-10-CM

## 2019-07-30 MED ORDER — MULTI-VITAMIN/MINERALS PO TABS: 1.0000 | ORAL_TABLET | Freq: Every day | ORAL | 0 refills | Status: DC

## 2019-07-30 MED ORDER — MEDROXYPROGESTERONE ACETATE 150 MG/ML IM SUSP
150.0000 mg | INTRAMUSCULAR | Status: AC
  Administered 2019-07-30 – 2020-03-24 (×4): 150 mg via INTRAMUSCULAR

## 2019-07-30 NOTE — Progress Notes (Signed)
Depo given and tolerated well. Accepts MVI's. Tawny Hopping, RN

## 2019-07-30 NOTE — Progress Notes (Signed)
Family Planning Visit- Repeat Yearly Visit  Subjective:  Nicole Mcmillan is a 39 y.o. [phone]  being seen today for an well woman visit and to discuss family planning options.    She is currently using Depo-Provera injections for pregnancy prevention. Patient reports she does not want a pregnancy in the next year. Patient  has Positive depression screening; Obesity, unspecified; and Tobacco abuse on their problem list.  Chief Complaint  Patient presents with  . Gynecologic Exam  . Contraception    Patient reports she does not have menses on DMPA, and likes that. 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  Patient denies concerns today. Denies vag discharge/odor/itch.    See flowsheet for other program required questions.   Body mass index is 34.86 kg/m. - Patient is eligible for diabetes screening based on BMI and age >20?  no HA1C ordered? not applicable  Patient reports 1 of partners in last year. Desires STI screening?  No - states no exposure or symptoms   Has patient been screened once for HCV in the past?  No; pt declines testing.  No results found for: HCVAB  Does the patient have current of drug use, have a partner with drug use, and/or has been incarcerated since last result? No  If yes-- Screen for HCV through [facility]   Does the patient meet criteria for HBV testing? No  Criteria:  -Household, sexual or needle sharing contact with HBV -History of drug use -HIV positive -Those with known Hep C   Health Maintenance Due  Topic Date Due  . HIV Screening  Never done  . COVID-19 Vaccine (1) Never done  . TETANUS/TDAP  Never done    ROS  The following portions of the patient's history were reviewed and updated as appropriate: allergies, current medications, past family history, past medical history, past social history, past surgical history and problem list. Problem  list updated.  Objective:   Vitals:   07/30/19 1542  BP: 124/86  Weight: 196 lb 12.8 oz (89.3 kg)  Height: 5\' 3"  (1.6 m)    Physical Exam    Assessment and Plan:  ROLLA Mcmillan is a 39 y.o. female [phone] presenting to the [facility] Department for an yearly well woman exam/family planning visit  Contraception counseling: Reviewed all forms of birth control options in the tiered based approach. available including abstinence; over the counter/barrier methods; hormonal contraceptive medication including pill, patch, ring, injection,contraceptive implant, ECP; hormonal and nonhormonal IUDs; permanent sterilization options including vasectomy and the various tubal sterilization modalities. Risks, benefits, and typical effectiveness rates were reviewed.  Questions were answered.  Written information was also given to the patient to review.  Patient desires DMPA, this was presribed for patient. She will follow up in  3 mo  for surveillance.  She was told to call with any further questions, or with any concerns about this method of contraception.  Emphasized use of condoms 100% of the time for STI prevention.  1. Family planning Continue DMPA 150mg  IM today and repeat every 3 mo for 1 year. Next cervical cancer screening due 2024. - Multiple Vitamins-Minerals (MULTIVITAMIN WITH MINERALS) tablet; Take 1 tablet by mouth daily.  Dispense: 100 tablet; Refill: 0 - medroxyPROGESTERone (DEPO-PROVERA) injection 150 mg  2. Positive depression screening PHQ-9 score has decreased from 19 (05/2018) to 13 today, but is still abnormal. Pt  states she saw LCSW for a while last year, but declines referral back to her now because she "wants to deal with it" on her own. Enc walking.   3. Class 2 obesity with body mass index (BMI) of 35.0 to 35.9 in adult, unspecified obesity type, unspecified whether serious comorbidity present Enc diet/exercise; declined nutrition referral.  4. Tobacco  abuse Enc cessation - counseled via 5 As, pt not interested in quitting at present.   Return in about 3 months (around 10/30/2019) for Routine DMPA injection.  No future appointments.  Lora Havens, PA-C

## 2019-07-30 NOTE — Progress Notes (Signed)
Here today for PE and Depo. Last PE here was 06/02/2018, last Pap Smear was 04/22/2017 and last Depo was 04/21/2019 (14.2 weeks.) Declines STD screening today. Tawny Hopping, RN

## 2019-10-19 ENCOUNTER — Ambulatory Visit (LOCAL_COMMUNITY_HEALTH_CENTER): Payer: 59

## 2019-10-19 ENCOUNTER — Other Ambulatory Visit: Payer: Self-pay

## 2019-10-19 VITALS — BP 126/87 | Ht 63.0 in | Wt 200.5 lb

## 2019-10-19 DIAGNOSIS — Z30013 Encounter for initial prescription of injectable contraceptive: Secondary | ICD-10-CM

## 2019-10-19 DIAGNOSIS — Z3009 Encounter for other general counseling and advice on contraception: Secondary | ICD-10-CM

## 2019-10-19 NOTE — Progress Notes (Signed)
Depo administered without difficulty per 07/30/2019 written order of A. Streilein PA-C and client tolerated without complaint. Client states has plenty of MVI at home because she buys some in addition to the bottle we give her at the [facility]. Jossie Ng, RN

## 2020-01-07 ENCOUNTER — Ambulatory Visit (LOCAL_COMMUNITY_HEALTH_CENTER): Payer: 59

## 2020-01-07 ENCOUNTER — Other Ambulatory Visit: Payer: Self-pay

## 2020-01-07 VITALS — BP 134/89 | Ht 63.0 in | Wt 201.5 lb

## 2020-01-07 DIAGNOSIS — Z30013 Encounter for initial prescription of injectable contraceptive: Secondary | ICD-10-CM | POA: Diagnosis not present

## 2020-01-07 DIAGNOSIS — Z3009 Encounter for other general counseling and advice on contraception: Secondary | ICD-10-CM | POA: Diagnosis not present

## 2020-01-07 NOTE — Progress Notes (Signed)
11 weeks 3 days post depo. Depo administered per order A. Streilein, PA-C dated 07/30/19. Tolerated well. Depo consent signed today. Next depo due 03/24/2020. Reminder given. Jerel Shepherd, RN

## 2020-03-24 ENCOUNTER — Other Ambulatory Visit: Payer: Self-pay

## 2020-03-24 ENCOUNTER — Ambulatory Visit (LOCAL_COMMUNITY_HEALTH_CENTER): Payer: 59

## 2020-03-24 VITALS — BP 139/93 | Ht 63.0 in | Wt 201.5 lb

## 2020-03-24 DIAGNOSIS — Z3009 Encounter for other general counseling and advice on contraception: Secondary | ICD-10-CM | POA: Diagnosis not present

## 2020-03-24 DIAGNOSIS — Z30013 Encounter for initial prescription of injectable contraceptive: Secondary | ICD-10-CM | POA: Diagnosis not present

## 2020-03-24 NOTE — Progress Notes (Signed)
Consulted by RN re: patient with elevated BP and due to get Depo.  Reviewed RN note and agree that it reflects our discussion and my recommendations for patient.

## 2020-03-24 NOTE — Progress Notes (Signed)
11 weeks 0 days post depo. BP elevated today at 139/93. Reports vaping and drinking tea before appt. Explains increased stress today d/t recent death of aunt. Reports no PCP. Local provider resource list given. RN Counseled pt to establish PCP and to take BP regularly and record and have PCP evaluate. Pt in agreement.  Consult C. Rolley Sims, [place] who gives ok for Depo today based on order by A. Streilein, PA-C dated 07/30/2019. Depo given Left Deltoid without problem. Next depo due 06/09/20, pt aware. Jerel Shepherd, RN

## 2020-06-21 ENCOUNTER — Other Ambulatory Visit: Payer: Self-pay

## 2020-06-21 ENCOUNTER — Ambulatory Visit (LOCAL_COMMUNITY_HEALTH_CENTER): Payer: 59

## 2020-06-21 VITALS — BP 133/88 | Ht 63.0 in | Wt 202.0 lb

## 2020-06-21 DIAGNOSIS — Z3009 Encounter for other general counseling and advice on contraception: Secondary | ICD-10-CM | POA: Diagnosis not present

## 2020-06-21 DIAGNOSIS — Z30013 Encounter for initial prescription of injectable contraceptive: Secondary | ICD-10-CM | POA: Diagnosis not present

## 2020-06-21 MED ORDER — MEDROXYPROGESTERONE ACETATE 150 MG/ML IM SUSP
150.0000 mg | Freq: Once | INTRAMUSCULAR | Status: AC
  Administered 2020-06-21: 150 mg via INTRAMUSCULAR

## 2020-06-21 NOTE — Progress Notes (Signed)
12 weeks 5 days post depo. Voices no concerns. Depo given today per order by A. Streilein, PA-C dated 07/30/2019. Tolerated depo well in R delt. Physical due at next depo, approx 09/06/2020, pt has reminder. Jerel Shepherd, RN

## 2020-08-12 ENCOUNTER — Encounter: Payer: Self-pay | Admitting: Physician Assistant

## 2020-08-12 ENCOUNTER — Ambulatory Visit: Payer: 59

## 2020-08-12 ENCOUNTER — Other Ambulatory Visit: Payer: Self-pay

## 2020-08-12 ENCOUNTER — Ambulatory Visit: Payer: Self-pay | Admitting: Physician Assistant

## 2020-08-12 DIAGNOSIS — Z113 Encounter for screening for infections with a predominantly sexual mode of transmission: Secondary | ICD-10-CM

## 2020-08-12 LAB — WET PREP FOR TRICH, YEAST, CLUE
Trichomonas Exam: NEGATIVE
Yeast Exam: NEGATIVE

## 2020-08-12 NOTE — Progress Notes (Signed)
Wet mount reviewed with provider. All orders were completed.

## 2020-08-12 NOTE — Progress Notes (Signed)
  [facility] Department STI clinic/screening visit  Subjective:  Nicole Mcmillan is a 40 y.o. female being seen today for an STI screening visit. The patient reports they do not have symptoms.  Patient reports that they do not desire a pregnancy in the next year.   They reported they are not interested in discussing contraception today.  No LMP recorded. Patient has had an injection.   Patient has the following medical conditions:   Patient Active Problem List   Diagnosis Date Noted  . Tobacco abuse 07/30/2019  . Positive depression screening 06/02/2018  . Obesity, unspecified 04/22/2017    Chief Complaint  Patient presents with  . SEXUALLY TRANSMITTED DISEASE    STI screen    HPI  Patient reports that she is not having any symptoms but would like a screening.  Denies chronic conditions and regular medicines.  States last HIV test was years ago and last pap was in 2019.  States that she does not have a period since she uses Depo as her BCM.   See flowsheet for further details and programmatic requirements.    The following portions of the patient's history were reviewed and updated as appropriate: allergies, current medications, past medical history, past social history, past surgical history and problem list.  Objective:  There were no vitals filed for this visit.  Physical Exam Constitutional:      General: She is not in acute distress.    Appearance: Normal appearance.  HENT:     Head: Normocephalic and atraumatic.  Eyes:     Conjunctiva/sclera: Conjunctivae normal.  Pulmonary:     Effort: Pulmonary effort is normal.  Skin:    General: Skin is warm and dry.  Neurological:     Mental Status: She is alert and oriented to person, place, and time.  Psychiatric:        Mood and Affect: Mood normal.        Behavior: Behavior normal.        Thought Content: Thought content normal.        Judgment: Judgment normal.      Assessment and Plan:  Nicole Mcmillan is a 40 y.o. female presenting to the [facility] Department for STI screening  1. Screening for STD (sexually transmitted disease) Patient into clinic without symptoms. Patient declines pelvic exam and opts to self-collect vaginal samples.  Counseled how to collect for accurate results. Reviewed with patient that wet mount is normal and no treatment is indicated today. Rec condoms with all sex. Await test results.  Counseled that RN will call if needs to RTC for treatment once results are back. - WET PREP FOR TRICH, YEAST, CLUE - Chlamydia/Gonorrhea [facility] - [facility] - Syphilis Serology, [facility]     No follow-ups on file.  No future appointments.  Matt Holmes, PA

## 2020-08-19 LAB — HM HIV SCREENING LAB: HM HIV Screening: NEGATIVE

## 2020-09-23 ENCOUNTER — Ambulatory Visit (LOCAL_COMMUNITY_HEALTH_CENTER): Payer: 59 | Admitting: Family Medicine

## 2020-09-23 ENCOUNTER — Other Ambulatory Visit: Payer: Self-pay

## 2020-09-23 VITALS — BP 113/73 | HR 92 | Temp 98.8°F | Resp 18 | Ht 64.0 in | Wt 205.0 lb

## 2020-09-23 DIAGNOSIS — Z0000 Encounter for general adult medical examination without abnormal findings: Secondary | ICD-10-CM | POA: Diagnosis not present

## 2020-09-23 DIAGNOSIS — Z3009 Encounter for other general counseling and advice on contraception: Secondary | ICD-10-CM | POA: Diagnosis not present

## 2020-09-23 DIAGNOSIS — Z30013 Encounter for initial prescription of injectable contraceptive: Secondary | ICD-10-CM | POA: Diagnosis not present

## 2020-09-23 MED ORDER — MEDROXYPROGESTERONE ACETATE 150 MG/ML IM SUSP
150.0000 mg | INTRAMUSCULAR | Status: AC
  Administered 2020-09-23 – 2021-07-06 (×4): 150 mg via INTRAMUSCULAR

## 2020-09-23 NOTE — Progress Notes (Signed)
Family Planning Visit- Repeat Yearly Visit  Subjective:  Nicole Mcmillan is a 40 y.o. [phone]  being seen today for an annual wellness visit and to discuss contraception options.   The patient is currently using Depo Provera for pregnancy prevention. Patient does not want a pregnancy in the next year. Patient has the following medical problems: has Positive depression screening; Obesity, unspecified; and Tobacco abuse on their problem list.  Chief Complaint  Patient presents with   Annual Exam   Contraception    Patient reports here for physical and depo.    Patient denies any problems, or s/sx of infection  See flowsheet for other program required questions.   Body mass index is 35.19 kg/m. - Patient is eligible for diabetes screening based on BMI and age >34?  not applicable HA1C ordered? not applicable  Patient reports 2 of partners in last year. Desires STI screening?  No - patient declined    Has patient been screened once for HCV in the past?  No  No results found for: HCVAB  Does the patient have current of drug use, have a partner with drug use, and/or has been incarcerated since last result? No  If yes-- Screen for HCV through [facility]   Does the patient meet criteria for HBV testing? No  Criteria:  -Household, sexual or needle sharing contact with HBV -History of drug use -HIV positive -Those with known Hep C   Health Maintenance Due  Topic Date Due   COVID-19 Vaccine (1) Never done   Pneumococcal Vaccine 73-28 Years old (1 - PCV) Never done   Hepatitis C Screening  Never done   TETANUS/TDAP  Never done    Review of Systems  Constitutional:  Negative for chills, fever, malaise/fatigue and weight loss.  HENT:  Negative for congestion, hearing loss and sore throat.   Eyes:  Negative for blurred vision, double vision and photophobia.  Respiratory:  Negative for shortness of breath.   Cardiovascular:  Negative for chest pain.  Gastrointestinal:   Negative for abdominal pain, blood in stool, constipation, diarrhea, heartburn, nausea and vomiting.  Genitourinary:  Negative for dysuria and frequency.  Musculoskeletal:  Negative for back pain, joint pain and neck pain.  Skin:  Negative for itching and rash.  Neurological:  Negative for dizziness, weakness and headaches.  Endo/Heme/Allergies:  Does not bruise/bleed easily.  Psychiatric/Behavioral:  Negative for depression, substance abuse and suicidal ideas.    The following portions of the patient's history were reviewed and updated as appropriate: allergies, current medications, past family history, past medical history, past social history, past surgical history and problem list. Problem list updated.  Objective:   Vitals:   09/23/20 1447  BP: 113/73  Pulse: 92  Resp: 18  Temp: 98.8 F (37.1 C)  TempSrc: Oral  Weight: 205 lb (93 kg)  Height: 5\' 4"  (1.626 m)    Physical Exam Vitals and nursing note reviewed.  Constitutional:      Appearance: Normal appearance.  HENT:     Head: Normocephalic and atraumatic.     Mouth/Throat:     Mouth: Mucous membranes are moist.     Dentition: Abnormal dentition. No dental caries.     Pharynx: No oropharyngeal exudate or posterior oropharyngeal erythema.     Comments: Poor dental care, no visible dental abnormalities  Eyes:     General: No scleral icterus. Neck:     Thyroid: No thyroid mass or thyromegaly.  Cardiovascular:     Rate and  Rhythm: Normal rate and regular rhythm.     Pulses: Normal pulses.     Heart sounds: Normal heart sounds.  Pulmonary:     Effort: Pulmonary effort is normal.     Breath sounds: Normal breath sounds.  Chest:     Comments: Breasts:        Right: Normal. No swelling, mass, nipple discharge, skin change or tenderness.        Left: Normal. No swelling, mass, nipple discharge, skin change or tenderness.   Abdominal:     General: Abdomen is flat. Bowel sounds are normal.     Palpations: Abdomen is  soft.  Genitourinary:    Comments: Deferred  Musculoskeletal:        General: Normal range of motion.     Cervical back: Normal range of motion and neck supple.  Lymphadenopathy:     Cervical: No cervical adenopathy.  Skin:    General: Skin is warm and dry.  Neurological:     General: No focal deficit present.     Mental Status: She is alert and oriented to person, place, and time.  Psychiatric:        Behavior: Behavior normal.      Assessment and Plan:  Nicole Mcmillan is a 40 y.o. female [phone] presenting to the [facility] Department for an yearly wellness and contraception visit   1. Routine general medical examination at a health care facility Well woman exam CBE today- discussed self breast exam, teach back completed.   PAP due 07/2022  2. Encounter for initial prescription of injectable contraceptive RX : Depo 150 mg IM every 11-13 weeks x 1 year.   - medroxyPROGESTERone (DEPO-PROVERA) injection 150 mg  3. Family planning counseling Contraception counseling: Reviewed all forms of birth control options in the tiered based approach. available including abstinence; over the counter/barrier methods; hormonal contraceptive medication including pill, patch, ring, injection,contraceptive implant, ECP; hormonal and nonhormonal IUDs; permanent sterilization options including vasectomy and the various tubal sterilization modalities. Risks, benefits, and typical effectiveness rates were reviewed.  Questions were answered.  Written information was also given to the patient to review.  Patient desires depo provera, this was prescribed for patient. She will follow up in  11-13 weeks  for surveillance.  She was told to call with any further questions, or with any concerns about this method of contraception.  Emphasized use of condoms 100% of the time for STI prevention.  Patient was not offered ECP based on current BCM.     Return in about 3 months (around 12/24/2020) for  depo.  No future appointments.  Wendi Snipes, FNP

## 2020-09-23 NOTE — Progress Notes (Signed)
13 weeks 2 days post depo. Voices no concerns. Depo given today per order by M.Garner, FNP dated 09/23/20. Tolerated depo well in R delt. Physical done today, up to date. Depo consent signed today.   Pt has reminder card for next depo in 12 weeks.   Floy Sabina, RN

## 2021-01-09 ENCOUNTER — Ambulatory Visit (LOCAL_COMMUNITY_HEALTH_CENTER): Payer: 59 | Admitting: Physician Assistant

## 2021-01-09 ENCOUNTER — Other Ambulatory Visit: Payer: Self-pay

## 2021-01-09 VITALS — BP 137/98 | Ht 64.0 in | Wt 209.0 lb

## 2021-01-09 DIAGNOSIS — Z3042 Encounter for surveillance of injectable contraceptive: Secondary | ICD-10-CM | POA: Diagnosis not present

## 2021-01-09 DIAGNOSIS — Z3009 Encounter for other general counseling and advice on contraception: Secondary | ICD-10-CM

## 2021-01-09 NOTE — Progress Notes (Signed)
Although documented at same time, there was at least a 10 minute time difference between initial and repeat BP. Per verbal order of Sadie Haber PA, may administer Depo as written 09/23/2020 per Elveria Rising FNP-BC (elevated BP at appt today - [facility] info sheet given to client and encouraged medical evaluaton of BP). Client counseled recommended to receive Depo every 11 - 13 weeks for most effective pregnancy prevention and ot minimize breakthough vaginal bleeding. Client tolerated Depo injection without complaint. Jossie Ng, RN

## 2021-01-10 NOTE — Progress Notes (Signed)
Patient into clinic for Depo and is 15 wk 3d since last shot.  Patient with elevated BP today, but OK to give Depo.  Reviewed RN note and agree with advice to follow up with PCP regarding BP.

## 2021-04-06 ENCOUNTER — Ambulatory Visit (LOCAL_COMMUNITY_HEALTH_CENTER): Payer: 59

## 2021-04-06 ENCOUNTER — Other Ambulatory Visit: Payer: Self-pay

## 2021-04-06 VITALS — BP 135/92 | Ht 64.0 in | Wt 213.5 lb

## 2021-04-06 DIAGNOSIS — Z3009 Encounter for other general counseling and advice on contraception: Secondary | ICD-10-CM

## 2021-04-06 DIAGNOSIS — Z3042 Encounter for surveillance of injectable contraceptive: Secondary | ICD-10-CM

## 2021-04-06 DIAGNOSIS — Z30013 Encounter for initial prescription of injectable contraceptive: Secondary | ICD-10-CM | POA: Diagnosis not present

## 2021-04-06 NOTE — Progress Notes (Signed)
12 weeks 3 days post depo. BP elevated today 135/92 and rechecked at 134/96. Admits to vaping nicotine before appt. No PCP. Consult A White, FNP who advises pt to establish PCP, and have BP evaluated.  Advises RN to counsel pt to stop vaping and to not vape before appt. Per provider, OK to give depo today based on 09/23/2020 order by Hilario Quarry, FNP. RN carried out provider orders. Pt in agreement. Depo given and tolerated well R delt. Local PCP provider list given and explained. Questions answered and reports understanding. Next depo due 06/22/2021, has reminder. Josie Saunders, RN

## 2021-07-06 ENCOUNTER — Ambulatory Visit (LOCAL_COMMUNITY_HEALTH_CENTER): Payer: 59

## 2021-07-06 VITALS — BP 127/84 | Ht 64.0 in | Wt 214.0 lb

## 2021-07-06 DIAGNOSIS — Z3042 Encounter for surveillance of injectable contraceptive: Secondary | ICD-10-CM

## 2021-07-06 DIAGNOSIS — Z3009 Encounter for other general counseling and advice on contraception: Secondary | ICD-10-CM

## 2021-07-06 DIAGNOSIS — Z30013 Encounter for initial prescription of injectable contraceptive: Secondary | ICD-10-CM

## 2021-07-06 NOTE — Progress Notes (Signed)
13 weeks 0 days post depo. Voices no concerns. Depo given today per order by Elveria Rising, FNP dated 09/23/2021. Tolerated well L delt. Next depo due 09/21/2021 and pt plans to schedule when return PE due, approx 09/24/2021, has reminder. Jerel Shepherd, RN ? ?

## 2021-10-04 ENCOUNTER — Ambulatory Visit: Payer: 59

## 2021-10-13 ENCOUNTER — Ambulatory Visit (LOCAL_COMMUNITY_HEALTH_CENTER): Payer: 59 | Admitting: Nurse Practitioner

## 2021-10-13 ENCOUNTER — Encounter: Payer: Self-pay | Admitting: Nurse Practitioner

## 2021-10-13 VITALS — BP 136/95 | Ht 64.0 in | Wt 211.6 lb

## 2021-10-13 DIAGNOSIS — Z3009 Encounter for other general counseling and advice on contraception: Secondary | ICD-10-CM

## 2021-10-13 DIAGNOSIS — Z3042 Encounter for surveillance of injectable contraceptive: Secondary | ICD-10-CM | POA: Diagnosis not present

## 2021-10-13 DIAGNOSIS — Z0000 Encounter for general adult medical examination without abnormal findings: Secondary | ICD-10-CM

## 2021-10-13 MED ORDER — MEDROXYPROGESTERONE ACETATE 150 MG/ML IM SUSP
150.0000 mg | INTRAMUSCULAR | Status: AC
  Administered 2021-10-13 – 2022-07-03 (×4): 150 mg via INTRAMUSCULAR

## 2021-10-13 NOTE — Progress Notes (Signed)
[facility] DEPARTMENT [facility] [address] Main Number: [phone]    Family Planning Visit- Initial Visit  Subjective:  Nicole Mcmillan is a 41 y.o.  [phone]   being seen today for an initial annual visit and to discuss reproductive life planning.  The patient is currently using {Upstream End Methods:24109} for pregnancy prevention. Patient reports   {DOES_DOES YWV:37106} want a pregnancy in the next year.     report they are looking for a method that provides {Contraception Wants:27264}  Patient has the following medical conditions has Positive depression screening; Obesity, unspecified; and Tobacco abuse on their problem list.  Chief Complaint  Patient presents with  . Contraception    PE and Depo    Patient reports***  Patient denies ***   Body mass index is 36.32 kg/m. - Patient is eligible for diabetes screening based on BMI and age >66?  {YES/NO/NOT APPLICABLE:20182} HA1C ordered? {YES/NO/NOT APPLICABLE:20182}  Patient reports {NUMBER 1-10:22536}  partner/s in last year. Desires STI screening?  {Yes or If no, why not?:20788}  Has patient been screened once for HCV in the past?  {yes/no:20286}  No results found for: "HCVAB"  Does the patient have current drug use (including MJ), have a partner with drug use, and/or has been incarcerated since last result? {yes/no:20286}  If yes-- Screen for HCV through [facility]   Does the patient meet criteria for HBV testing? {yes/no:20286}  Criteria:  -Household, sexual or needle sharing contact with HBV -History of drug use -HIV positive -Those with known Hep C   Health Maintenance Due  Topic Date Due  . COVID-19 Vaccine (1) Never done  . Hepatitis C Screening  Never done  . TETANUS/TDAP  09/10/2016    ROS  The following portions of the patient's history were reviewed and updated as appropriate: allergies, current medications, past family history, past medical history,  past social history, past surgical history and problem list. Problem list updated.   See flowsheet for other program required questions.  Objective:   Vitals:   10/13/21 1501 10/13/21 1515  BP: (!) 134/94 (!) 136/95  Weight: 211 lb 9.6 oz (96 kg)   Height: 5\' 4"  (1.626 m)     Physical Exam    Assessment and Plan:  Nicole Mcmillan is a 41 y.o. female presenting to the [facility] Department for an initial annual wellness/contraceptive visit  Contraception counseling: Reviewed options based on patient desire and reproductive life plan. Patient is interested in {Upstream End Methods:24109}. This {WAS/WAS NOT:[id number]::"was not"} provided to the patient today. *** if not why not clearly documented  Risks, benefits, and typical effectiveness rates were reviewed.  Questions were answered.  Written information was also given to the patient to review.    The patient will follow up in  {NUMBER 1-10:22536} {days/wks/mos/yrs:310907} for surveillance.  The patient was told to call with any further questions, or with any concerns about this method of contraception.  Emphasized use of condoms 100% of the time for STI prevention.  Need for ECP was assessed. Patient reported {unprotected sex categories:26659}.  Reviewed options and patient desired {ECP options:27263}   1. Encounter for management and injection of depo-Provera *** - medroxyPROGESTERone (DEPO-PROVERA) injection 150 mg     No follow-ups on file.  No future appointments.  [facility], FNP

## 2021-10-13 NOTE — Progress Notes (Unsigned)
Pt here for PE and Depo. Depo 150 mg given IM in Rt deltoid.  Pt tolerated well.  Pt given reminder card to return in 11-13 weeks for next Depo injection.  FP packet given.  Berdie Ogren, RN

## 2022-01-15 ENCOUNTER — Ambulatory Visit (LOCAL_COMMUNITY_HEALTH_CENTER): Payer: 59

## 2022-01-15 ENCOUNTER — Ambulatory Visit: Payer: 59

## 2022-01-15 VITALS — BP 112/74 | Ht 64.0 in | Wt 199.5 lb

## 2022-01-15 DIAGNOSIS — Z3042 Encounter for surveillance of injectable contraceptive: Secondary | ICD-10-CM

## 2022-01-15 DIAGNOSIS — Z3009 Encounter for other general counseling and advice on contraception: Secondary | ICD-10-CM | POA: Diagnosis not present

## 2022-01-15 NOTE — Progress Notes (Signed)
13 weeks and 3 days post Depo.  Voices no concerns. Medroxyprogesterone Acetate 150 mg. given per order dated 10/13/2021 by Gregary Cromer, FNP.  Given rt deltoid - patient preference. Reminder card provided and informed an  appointment would need to be made.

## 2022-04-10 ENCOUNTER — Ambulatory Visit (LOCAL_COMMUNITY_HEALTH_CENTER): Payer: 59

## 2022-04-10 VITALS — BP 138/90 | Ht 64.0 in | Wt 207.5 lb

## 2022-04-10 DIAGNOSIS — Z3042 Encounter for surveillance of injectable contraceptive: Secondary | ICD-10-CM

## 2022-04-10 DIAGNOSIS — Z3009 Encounter for other general counseling and advice on contraception: Secondary | ICD-10-CM | POA: Diagnosis not present

## 2022-04-10 NOTE — Progress Notes (Signed)
12 weeks post Depo.  Voiced no concerns.  Medroxyprogesterone Acetate 150 mg x 1 year per order by Gregary Cromer, FNP dated 10/13/2021.  Given IM in right deltoid. Tolerated well. Reminder card provided to call for next appointment due 06/26/2022.

## 2022-07-03 ENCOUNTER — Ambulatory Visit (LOCAL_COMMUNITY_HEALTH_CENTER): Payer: Self-pay

## 2022-07-03 ENCOUNTER — Ambulatory Visit: Payer: Self-pay

## 2022-07-03 VITALS — BP 122/86 | Ht 64.0 in | Wt 210.5 lb

## 2022-07-03 DIAGNOSIS — Z3042 Encounter for surveillance of injectable contraceptive: Secondary | ICD-10-CM

## 2022-07-03 DIAGNOSIS — Z3009 Encounter for other general counseling and advice on contraception: Secondary | ICD-10-CM

## 2022-07-03 DIAGNOSIS — Z30013 Encounter for initial prescription of injectable contraceptive: Secondary | ICD-10-CM

## 2022-07-03 DIAGNOSIS — Z308 Encounter for other contraceptive management: Secondary | ICD-10-CM

## 2022-07-03 NOTE — Progress Notes (Signed)
12 weeks 0 days post depo. Voices no concerns. Depo given today per order by Onalee Hua, FNP dated 10/13/2021. Tolerated well L delt. Next depo due 09/18/2022 and annual PE due 10/15/2022, pt aware. Jerel Shepherd, RN

## 2022-10-23 ENCOUNTER — Ambulatory Visit: Payer: Self-pay

## 2023-05-09 ENCOUNTER — Ambulatory Visit (LOCAL_COMMUNITY_HEALTH_CENTER): Payer: Self-pay | Admitting: Family Medicine

## 2023-05-09 ENCOUNTER — Encounter: Payer: Self-pay | Admitting: Family Medicine

## 2023-05-09 ENCOUNTER — Ambulatory Visit: Payer: Self-pay

## 2023-05-09 VITALS — Ht 65.0 in | Wt 217.0 lb

## 2023-05-09 DIAGNOSIS — Z3009 Encounter for other general counseling and advice on contraception: Secondary | ICD-10-CM

## 2023-05-09 DIAGNOSIS — F32A Depression, unspecified: Secondary | ICD-10-CM

## 2023-05-09 DIAGNOSIS — Z30013 Encounter for initial prescription of injectable contraceptive: Secondary | ICD-10-CM

## 2023-05-09 DIAGNOSIS — Z01419 Encounter for gynecological examination (general) (routine) without abnormal findings: Secondary | ICD-10-CM

## 2023-05-09 MED ORDER — MEDROXYPROGESTERONE ACETATE 150 MG/ML IM SUSP
150.0000 mg | INTRAMUSCULAR | Status: AC
  Administered 2023-05-09 – 2024-01-21 (×4): 150 mg via INTRAMUSCULAR

## 2023-05-09 MED ORDER — ULIPRISTAL ACETATE 30 MG PO TABS: 1.0000 | ORAL_TABLET | Freq: Once | ORAL | Status: AC

## 2023-05-09 NOTE — Progress Notes (Signed)
[facility] DEPARTMENT [facility] [address], [address] [place] [address] Main phone: [phone]  Family Planning Visit - Repeat Yearly Visit  Subjective:  Nicole Mcmillan is a 43 y.o. [phone]  being seen today for an annual wellness visit and to discuss contraception options. The patient is currently using no method - no contraceptive precautions for pregnancy prevention. Patient does not want a pregnancy in the next year.   Patient reports they are looking for a method with the following characteristics:  High efficacy at preventing pregnancy  Patient has the following medical problems:  Patient Active Problem List   Diagnosis Date Noted   Tobacco abuse 07/30/2019   Positive depression screening 06/02/2018   Obesity, unspecified 04/22/2017    Chief Complaint  Patient presents with   Annual Exam    depo    HPI Patient reports to clinic to restart depo.   Review of Systems  Constitutional:  Negative for weight loss.  Eyes:  Negative for blurred vision.  Respiratory:  Negative for cough and shortness of breath.   Cardiovascular:  Negative for claudication.  Gastrointestinal:  Negative for nausea.  Genitourinary:  Negative for dysuria and frequency.  Skin:  Negative for rash.  Neurological:  Negative for headaches.  Endo/Heme/Allergies:  Does not bruise/bleed easily.  Psychiatric/Behavioral:  Positive for depression and suicidal ideas.     See flowsheet for other program required questions.   Diabetes screening This patient is 43 y.o. with a BMI of Body mass index is 36.11 kg/m.Marland Kitchen  Is patient eligible for diabetes screening (age >35 and BMI >25)?  no  Was Hgb A1c ordered? not applicable  STI screening Patient reports 1 of partners in last year.  Does this patient desire STI screening?  No - declined  Hepatitis C screening Has patient been screened once for HCV in the past?  No  No results found for: "HCVAB"  Does  the patient meet criteria for HCV testing? No  (If yes-- Screen for HCV through [facility]) Criteria:  Since the last HCV result, does the patient have any of the following? - Current drug use - Have a partner with drug use - Has been incarcerated  Hepatitis B screening Does the patient meet criteria for HBV testing? No Criteria:  -Household, sexual or needle sharing contact with HBV -History of drug use -HIV positive -Those with known Hep C  Cervical Cancer Screening  Result Date Procedure Results Follow-ups  04/23/2017 Pap IG and HPV (high risk) DNA detection Pap Smear: NILM HPV: HRHPV - Transformation Zone: Present due 04/24/2022  04/22/2017 HM PAP SMEAR HM Pap smear: Negative, HPV negative     Health Maintenance Due  Topic Date Due   Hepatitis C Screening  Never done   DTaP/Tdap/Td (2 - Td or Tdap) 09/10/2016   Cervical Cancer Screening (HPV/Pap Cotest)  04/23/2020   INFLUENZA VACCINE  Never done   COVID-19 Vaccine (1 - 2024-25 season) Never done    The following portions of the patient's history were reviewed and updated as appropriate: allergies, current medications, past family history, past medical history, past social history, past surgical history and problem list. Problem list updated.  Objective:   Vitals:   05/09/23 1301  Weight: 217 lb (98.4 kg)  Height: 5\' 5"  (1.651 m)    Physical Exam Vitals and nursing note reviewed. Exam conducted with a chaperone present Charlyne Mom).  Constitutional:      Appearance: She is obese.  HENT:     Head: Normocephalic and atraumatic.     Mouth/Throat:     Mouth: Mucous membranes are moist.     Pharynx: Oropharynx is clear. No oropharyngeal exudate or posterior oropharyngeal erythema.  Pulmonary:     Effort: Pulmonary effort is normal.  Chest:     Chest wall: No mass.  Breasts:    Tanner Score is 5.     Right: Normal.     Left: Normal.  Abdominal:     General: Abdomen is flat.     Palpations: There is no  mass.     Tenderness: There is no abdominal tenderness. There is no rebound.  Genitourinary:    General: Normal vulva.     Exam position: Lithotomy position.     Pubic Area: No rash or pubic lice.      Labia:        Right: No rash or lesion.        Left: No rash or lesion.      Vagina: Normal. No vaginal discharge, erythema, bleeding or lesions.     Cervix: No cervical motion tenderness, discharge, friability, lesion or erythema.     Uterus: Normal.      Adnexa: Right adnexa normal and left adnexa normal.     Rectum: Normal.  Lymphadenopathy:     Head:     Right side of head: No preauricular or posterior auricular adenopathy.     Left side of head: No preauricular or posterior auricular adenopathy.     Cervical: No cervical adenopathy.     Upper Body:     Right upper body: No supraclavicular, axillary or epitrochlear adenopathy.     Left upper body: No supraclavicular, axillary or epitrochlear adenopathy.     Lower Body: No right inguinal adenopathy. No left inguinal adenopathy.  Skin:    General: Skin is warm and dry.     Findings: No rash.  Neurological:     Mental Status: She is alert and oriented to person, place, and time.     Assessment and Plan:  Nicole Mcmillan is a 43 y.o. female [phone] presenting to the [facility] Department for an yearly wellness and contraception visit  1. Family planning (Primary) Contraception counseling: Reviewed options based on patient desire and reproductive life plan. Patient is interested in Hormonal Injection. This was provided to the patient today.   Risks, benefits, and typical effectiveness rates were reviewed.  Questions were answered.  Written information was also given to the patient to review.    The patient will follow up in  3 months for surveillance.  The patient was told to call with any further questions, or with any concerns about this method of contraception.  Emphasized use of condoms 100% of the time for STI  prevention.  Educated on ECP and assessed need for ECP. Patient was offered ECP based on Unprotected sex within past 72 hours.  Patient is within 1 days of unprotected sex. Patient was offered ECP. Reviewed options and patient desired Ella (Ulipristal)   - medroxyPROGESTERone (DEPO-PROVERA) injection 150 mg - ulipristal acetate (ELLA) 30 MG tablet; Take 1 tablet (30 mg total) by mouth once for 1 dose.  2. Well woman exam with routine gynecological exam -CBE today (normal), has never had a mammogram before- given BCCCP information, encouraged to call for appointment -repeat pap today  - IGP, Aptima HPV - Hgb A1c w/o eAG  3. Depression, unspecified depression type -PHQ-9 score of 16, with  more than half the days marked for "thoughts of better off dead". P4 screener: Have you ever harmed yourself in the past? NO Have you thought about how you would harm yourself? YES- "I would just go to sleep" How likely is it that you would harm yourself in the next month? NOT LIKELY Is there anything preventing you from harming yourself? YES- "My Kids"  Patient identified that she has 3 children at home (48, 67, and 9), and 1 grand baby who is 2 yo. She said she has spoken to her husband regarding these feelings, and "Would never do anything to hurt myself, Its just a thought, I don't want people to think I'm crazy".  Reports stress include: financials (currently worried about power being cut off because she is behind on the bills), "past experiences", and "Life is just getting hard"  Agreeable to trying medication- but worries about cost and access to care. Provided with resources today including [facility] (discussion about walk in hours for eval), *988, [facility] -Agrees to referral for therapy with Farrel Gobble  - Ambulatory referral to Behavioral Health   Return in about 3 months (around 08/06/2023) for depo injection.  No future appointments.  Lenice Llamas, [place]

## 2023-05-09 NOTE — Progress Notes (Signed)
PT is here for physical and depo.  Depo given in L deltoid and tolerated well.  Date for next injection along with [facility], Kathreen Cosier and [facility] info given to pt.  FP packet given and reviewed along with ECP instructions. The patient was dispensed ella #1 today. I provided counseling today regarding the medication. We discussed the medication, the side effects and when to call clinic. Patient given the opportunity to ask questions. Questions answered.  Condoms declined. Gaspar Garbe, RN

## 2023-05-10 ENCOUNTER — Encounter: Payer: Self-pay | Admitting: Family Medicine

## 2023-05-10 LAB — HGB A1C W/O EAG: Hgb A1c MFr Bld: 5.7 % — ABNORMAL HIGH (ref 4.8–5.6)

## 2023-05-15 ENCOUNTER — Encounter: Payer: Self-pay | Admitting: Family Medicine

## 2023-05-15 LAB — IGP, APTIMA HPV
HPV Aptima: NEGATIVE
PAP Smear Comment: 0

## 2023-07-29 ENCOUNTER — Ambulatory Visit: Payer: Self-pay

## 2023-07-29 VITALS — BP 144/90 | Ht 65.0 in | Wt 222.0 lb

## 2023-07-29 DIAGNOSIS — Z3009 Encounter for other general counseling and advice on contraception: Secondary | ICD-10-CM

## 2023-07-29 DIAGNOSIS — Z3042 Encounter for surveillance of injectable contraceptive: Secondary | ICD-10-CM

## 2023-07-29 NOTE — Progress Notes (Signed)
 11 Weeks   4 Days since last Depo. Voices no concerns today. B/P 140/100 and 144/90.  Patient reported no PCP and taking no medications for B/P. Patient reported she did vape and very stressed right now.  Consult with Fain Home, FNP regarding B/P.  Order to give and tell patient she needs to get a PCP and monitor B/P.  Discussed with patient the need of PCP and provided list of medical providers in the area.  Counseled to adhere to 11 to 13 week intervals between depo injections for optimal benefit.  Depo given today per order by [facility], FNP dated 05/09/2023. Tolerated well right deltoid.  Next depo due 10/14/2023. Has reminder card.

## 2023-10-21 ENCOUNTER — Ambulatory Visit: Payer: Self-pay

## 2023-10-21 VITALS — BP 146/86 | Ht 65.0 in | Wt 226.5 lb

## 2023-10-21 DIAGNOSIS — Z30013 Encounter for initial prescription of injectable contraceptive: Secondary | ICD-10-CM

## 2023-10-21 DIAGNOSIS — Z3042 Encounter for surveillance of injectable contraceptive: Secondary | ICD-10-CM

## 2023-10-21 DIAGNOSIS — Z3009 Encounter for other general counseling and advice on contraception: Secondary | ICD-10-CM

## 2023-10-21 NOTE — Progress Notes (Signed)
 12 week 0 days post depo. Voices no concerns. Depo given today per order by H.Middleton,FNP.Tolerated well R delt. Next Depo 01/06/24.  Consulted with provider regarding pt's bp 146/86.Got verbal order okay to give injection. Recommended to f/u with PCP. Provided resource list and counseled pt on effects of high Bp.

## 2024-01-09 ENCOUNTER — Ambulatory Visit: Payer: Self-pay

## 2024-01-21 ENCOUNTER — Ambulatory Visit (LOCAL_COMMUNITY_HEALTH_CENTER): Payer: Self-pay

## 2024-01-21 VITALS — BP 133/77 | Ht 65.0 in | Wt 227.5 lb

## 2024-01-21 DIAGNOSIS — Z3009 Encounter for other general counseling and advice on contraception: Secondary | ICD-10-CM

## 2024-01-21 DIAGNOSIS — Z30013 Encounter for initial prescription of injectable contraceptive: Secondary | ICD-10-CM

## 2024-01-21 DIAGNOSIS — Z3042 Encounter for surveillance of injectable contraceptive: Secondary | ICD-10-CM

## 2024-01-21 NOTE — Progress Notes (Signed)
 13 Weeks   1 Days since last Depo    Voices no concerns today.  Counseled to adhere to 11 to 13 week intervals between depo injections for optimal benefit.  Depo given today per order by VEAR Bers, FNP  dated 05/09/23.  Tolerated well L delt.  Next depo due 04/07/2024,  has reminder card.  Taro Hidrogo, RN

## 2024-04-15 ENCOUNTER — Ambulatory Visit: Payer: Self-pay

## 2024-04-20 ENCOUNTER — Ambulatory Visit: Payer: Self-pay
